# Patient Record
Sex: Female | Born: 1945 | Race: Black or African American | Hispanic: No | Marital: Married | Smoking: Never smoker
Health system: Southern US, Community
[De-identification: ages and names within clinical notes are randomized; demographics above are authoritative.]

## PROBLEM LIST (undated history)

## (undated) DIAGNOSIS — M1990 Unspecified osteoarthritis, unspecified site: Secondary | ICD-10-CM

## (undated) DIAGNOSIS — I10 Essential (primary) hypertension: Secondary | ICD-10-CM

## (undated) DIAGNOSIS — M109 Gout, unspecified: Secondary | ICD-10-CM

## (undated) DIAGNOSIS — E785 Hyperlipidemia, unspecified: Secondary | ICD-10-CM

## (undated) HISTORY — PX: JOINT REPLACEMENT: SHX530

## (undated) HISTORY — DX: Hyperlipidemia, unspecified: E78.5

## (undated) HISTORY — PX: APPENDECTOMY: SHX54

---

## 2013-04-27 ENCOUNTER — Ambulatory Visit: Payer: Medicare Other | Admitting: Obstetrics

## 2018-05-25 ENCOUNTER — Ambulatory Visit (INDEPENDENT_AMBULATORY_CARE_PROVIDER_SITE_OTHER): Payer: Medicare Other | Admitting: Orthopaedic Surgery

## 2018-06-25 ENCOUNTER — Ambulatory Visit (INDEPENDENT_AMBULATORY_CARE_PROVIDER_SITE_OTHER): Payer: Medicare Other

## 2018-06-25 ENCOUNTER — Ambulatory Visit (INDEPENDENT_AMBULATORY_CARE_PROVIDER_SITE_OTHER): Payer: Medicare Other | Admitting: Orthopaedic Surgery

## 2018-06-25 ENCOUNTER — Encounter (INDEPENDENT_AMBULATORY_CARE_PROVIDER_SITE_OTHER): Payer: Self-pay | Admitting: Orthopaedic Surgery

## 2018-06-25 DIAGNOSIS — M1612 Unilateral primary osteoarthritis, left hip: Secondary | ICD-10-CM | POA: Insufficient documentation

## 2018-06-25 DIAGNOSIS — M25551 Pain in right hip: Secondary | ICD-10-CM | POA: Diagnosis not present

## 2018-06-25 DIAGNOSIS — G8929 Other chronic pain: Secondary | ICD-10-CM

## 2018-06-25 DIAGNOSIS — M25562 Pain in left knee: Secondary | ICD-10-CM

## 2018-06-25 MED ORDER — HYDROCODONE-ACETAMINOPHEN 5-325 MG PO TABS: 1.0000 | ORAL_TABLET | Freq: Four times a day (QID) | ORAL | 0 refills | Status: DC | PRN

## 2018-06-25 NOTE — Progress Notes (Signed)
Office Visit Note   Patient: Jane Vasquez           Date of Birth: [birth year]           MRN: [id number] Visit Date: 06/25/2018              Requested by: No referring provider defined for this encounter. PCP: System, Pcp Not In   Assessment & Plan: Visit Diagnoses:  1. Chronic pain of left knee   2. Pain in right hip   3. Unilateral primary osteoarthritis, left hip     Plan: I talked in length in detail about hip replacement surgery.  I do feel that she is a candidate for a left total hip arthroplasty.  Her family is with her today.  We talked in detail about the intraoperative and postoperative course.  We talked about the details of surgery using a hip model as well as handout information about hip replacement surgery.  We had a long and thorough discussion about the risk and benefits of surgery.  We talked about what the recovery would be like as well.  All question concerns were answered and addressed.  For now we will treat her knees conservatively.  I did send her in some hydrocodone to use sparingly.  We will work on getting her surgery set up for February at her wishes.  We will then see her back in 2 weeks postoperative after surgery.  Follow-Up Instructions: Return for 2 weeks post-op.   Orders:  Orders Placed This Encounter  Procedures  . XR Knee 1-2 Views Left  . XR HIP UNILAT W OR W/O PELVIS 2-3 VIEWS RIGHT   Meds ordered this encounter  Medications  . HYDROcodone-acetaminophen (NORCO/VICODIN) 5-325 MG tablet    Sig: Take 1 tablet by mouth every 6 (six) hours as needed for moderate pain.    Dispense:  30 tablet    Refill:  0      Procedures: No procedures performed   Clinical Data: No additional findings.   Subjective: Chief Complaint  Patient presents with  . Right Hip - Pain  . Left Knee - Pain  Patient is a very pleasant and active 72 year old female [place] [facility]  Her family lives in the area and I have known 1 of her  daughters who is worked in the [employer] field in [place] in [place].  She has been dealing with right hip pain for many years now.  Is been getting worse for the last 3 to 4 years.  The past years had a significant increase in her right hip pain.  She has had conservative treatment with anti-inflammatories and an intra-articular steroid injection in the right hip.  She has tried to offload this hip with an assistive device with walking.  She walks with a sharp pain in her hip and a constant limp now.  At this point, her pain is 10 out of 10.  Her right hip pain is detrimentally affecting her activity living, her quality of life and her mobility.  She does wish to proceed with a total hip arthroplasty on the right side.  She would like me to look at her left knee today.  She has had some anterior knee pain and a constant ache now for about a year.  She for stands up and gets a sharp pain in that knee on occasion.  She is otherwise someone who just deals with high blood pressure.  She is not a diabetic.  She is not a smoker.  He is a very active individual.  She is very independent as well.  HPI  Review of Systems She currently denies any headache, chest pain, shortness of breath, fever, chills, nausea, vomiting.  Objective: Vital Signs: There were no vitals taken for this visit.  Physical Exam She is alert and orient x3 and in no acute distress Ortho Exam Examination of her right hip is very painful.  She has significant deficits in internal and external rotation of that hip.  She cannot cross her legs as well.  She has slight leg length discrepancy comparing the right and left hips.  Her left hip exam is entirely normal.  Examination of her knee shows valgus malalignment of both knees.  There is a slight effusion of her left knee.  The patella does show significant crepitation with flexion-extension. Specialty Comments:  No specialty comments available.  Imaging: Xr Hip Unilat W Or W/o  Pelvis 2-3 Views Right  Result Date: 06/25/2018 An AP pelvis and lateral the right hip shows significant protrusio of the right hip.  This is chronic appearing.  There are particular osteophytes.  There is cystic changes in the femoral head and acetabulum on the right side.  The arthritis is severe.  Xr Knee 1-2 Views Left  Result Date: 06/25/2018 2 views of the left knee show tricompartmental arthritic changes with osteophytes in all 3 compartments as well as joint space narrowing in all 3 compartments.  This would classify as severe osteoarthritis.  There is slight valgus malalignment.    PMFS History: Patient Active Problem List   Diagnosis Date Noted  . Unilateral primary osteoarthritis, left hip 06/25/2018   History reviewed. No pertinent past medical history.  History reviewed. No pertinent family history.  History reviewed. No pertinent surgical history. Social History   Occupational History  . Not on file  Tobacco Use  . Smoking status: Not on file  Substance and Sexual Activity  . Alcohol use: Not on file  . Drug use: Not on file  . Sexual activity: Not on file

## 2018-07-16 ENCOUNTER — Other Ambulatory Visit (INDEPENDENT_AMBULATORY_CARE_PROVIDER_SITE_OTHER): Payer: Self-pay | Admitting: Orthopaedic Surgery

## 2018-07-16 MED ORDER — HYDROCODONE-ACETAMINOPHEN 5-325 MG PO TABS: 1.0000 | ORAL_TABLET | Freq: Four times a day (QID) | ORAL | 0 refills | Status: DC | PRN

## 2018-07-16 NOTE — Telephone Encounter (Signed)
Unfortunately, this is not a medication we will refill regularly.

## 2018-07-16 NOTE — Telephone Encounter (Signed)
So are we sending one more and then no more?

## 2018-07-16 NOTE — Telephone Encounter (Signed)
Patient called to request a RX refill on her Hydrocodone.  She is [facility] [place]  CB#[phone].  Thank you.

## 2018-07-16 NOTE — Telephone Encounter (Signed)
Please advise 

## 2018-07-16 NOTE — Telephone Encounter (Signed)
I will send in one more prescription for hydrocodone.  However do call her and let her know that I cannot keep anyone on narcotics for a long period of time and that this would be the last prescription for that.

## 2018-07-17 MED ORDER — HYDROCODONE-ACETAMINOPHEN 5-325 MG PO TABS: 1.0000 | ORAL_TABLET | Freq: Four times a day (QID) | ORAL | 0 refills | Status: DC | PRN

## 2018-07-17 NOTE — Telephone Encounter (Signed)
I called patient and advised this would be the last Rx for hydrocodone. She expressed understanding.  Rx was sent to [facility], but patient requests it be [facility] [place] [facility] she [place]. I have changed pharmacy in chart, and called [facility] to cancel that script.  Can you please resend to [facility] in [place]? Thanks.

## 2018-07-31 ENCOUNTER — Telehealth (INDEPENDENT_AMBULATORY_CARE_PROVIDER_SITE_OTHER): Payer: Self-pay | Admitting: Orthopaedic Surgery

## 2018-07-31 MED ORDER — TRAMADOL HCL 50 MG PO TABS: 50.0000 mg | ORAL_TABLET | Freq: Three times a day (TID) | ORAL | 0 refills | Status: DC | PRN

## 2018-07-31 NOTE — Telephone Encounter (Signed)
Please advise 

## 2018-07-31 NOTE — Telephone Encounter (Signed)
Patient called to an RX for some pain medication.  I advised her that Dr. Magnus Ivan will probably not send in a RX for the Hydrocodone.  Patient wanted to know if Dr. Magnus Ivan would send her in some Tramadol to the [facility] in [place].  CB#[phone].  Thank you.

## 2018-07-31 NOTE — Telephone Encounter (Signed)
I sent in some tramadol 

## 2018-07-31 NOTE — Telephone Encounter (Signed)
I left voicemail for patient advising. 

## 2018-08-11 ENCOUNTER — Telehealth (INDEPENDENT_AMBULATORY_CARE_PROVIDER_SITE_OTHER): Payer: Self-pay | Admitting: Orthopaedic Surgery

## 2018-08-11 MED ORDER — TRAMADOL HCL 50 MG PO TABS: 50.0000 mg | ORAL_TABLET | Freq: Four times a day (QID) | ORAL | 0 refills | Status: DC | PRN

## 2018-08-11 NOTE — Telephone Encounter (Signed)
Please advise 

## 2018-08-11 NOTE — Telephone Encounter (Signed)
Patient called needing Rx refilled (Tramadol 50 mg) Patient asked that the [facility] to to [facility] [place]. The number to contact patient is [phone]

## 2018-08-24 ENCOUNTER — Telehealth (INDEPENDENT_AMBULATORY_CARE_PROVIDER_SITE_OTHER): Payer: Self-pay | Admitting: Orthopaedic Surgery

## 2018-08-24 MED ORDER — TRAMADOL HCL 50 MG PO TABS: 50.0000 mg | ORAL_TABLET | Freq: Four times a day (QID) | ORAL | 0 refills | Status: DC | PRN

## 2018-08-24 NOTE — Telephone Encounter (Signed)
Patient called wanting refill of Tramadol. [facility] [place] [facility]  Please call patient to [phone]

## 2018-08-24 NOTE — Telephone Encounter (Signed)
Please advise 

## 2018-09-01 ENCOUNTER — Telehealth (INDEPENDENT_AMBULATORY_CARE_PROVIDER_SITE_OTHER): Payer: Self-pay | Admitting: Orthopaedic Surgery

## 2018-09-01 MED ORDER — TRAMADOL HCL 50 MG PO TABS: 50.0000 mg | ORAL_TABLET | Freq: Four times a day (QID) | ORAL | 0 refills | Status: DC | PRN

## 2018-09-01 NOTE — Telephone Encounter (Signed)
Please advise 

## 2018-09-01 NOTE — Telephone Encounter (Signed)
Patient called requesting a refill of Tramadol today.  Please call patient to to advise.  [phone]

## 2018-09-02 ENCOUNTER — Telehealth (INDEPENDENT_AMBULATORY_CARE_PROVIDER_SITE_OTHER): Payer: Self-pay | Admitting: Orthopaedic Surgery

## 2018-09-02 MED ORDER — TRAMADOL HCL 50 MG PO TABS: 50.0000 mg | ORAL_TABLET | Freq: Four times a day (QID) | ORAL | 0 refills | Status: DC | PRN

## 2018-09-02 NOTE — Telephone Encounter (Signed)
Patient called stated that Rx called stated that Rx should've been sent to [facility] in [facility].  Please call patient as she is leaving for out of town.  [phone]

## 2018-09-02 NOTE — Telephone Encounter (Signed)
Tramadol please advise

## 2018-09-03 ENCOUNTER — Telehealth (INDEPENDENT_AMBULATORY_CARE_PROVIDER_SITE_OTHER): Payer: Self-pay | Admitting: Orthopaedic Surgery

## 2018-09-03 NOTE — Telephone Encounter (Signed)
Called patient back and LMOM for her letting her know I did call pharmacy and approve early pickup since she's going out of town but she will have to pay out of pocket because no matter what I say insurance won't cover early refill

## 2018-09-03 NOTE — Telephone Encounter (Signed)
Pt called in said she would like to have a call back form ashley in regards to a problem with [facility], the pharmacy says they have received the medication but that they cannot release it to her till tomorrow. Pt was suppose to go out of town and she cant leave tomorrow so the pharmacy needs an OKAY from Dr.Blackman to be able to give her the medication. Pt: [phone]

## 2018-09-03 NOTE — Telephone Encounter (Signed)
Patient called stating that the pharmacy advised her that she could not get the RX today unless they had the approval of Dr. Magnus Ivan.  She is going out of town and needs the medication today.  CB#[phone].  Thank you

## 2018-09-03 NOTE — Telephone Encounter (Signed)
LMOM for patient letting her know we called in Rx

## 2018-09-14 ENCOUNTER — Telehealth (INDEPENDENT_AMBULATORY_CARE_PROVIDER_SITE_OTHER): Payer: Self-pay | Admitting: Orthopaedic Surgery

## 2018-09-14 ENCOUNTER — Other Ambulatory Visit (INDEPENDENT_AMBULATORY_CARE_PROVIDER_SITE_OTHER): Payer: Self-pay | Admitting: Physician Assistant

## 2018-09-14 MED ORDER — TRAMADOL HCL 50 MG PO TABS: 50.0000 mg | ORAL_TABLET | Freq: Four times a day (QID) | ORAL | 0 refills | Status: DC | PRN

## 2018-09-14 NOTE — Telephone Encounter (Signed)
Sent in

## 2018-09-14 NOTE — Telephone Encounter (Signed)
Patient called requesting an RX refill on her tramadol.  [facility] [place] [place].  [phone].  Thank you.

## 2018-09-14 NOTE — Telephone Encounter (Signed)
Please advise 

## 2018-09-21 ENCOUNTER — Telehealth (INDEPENDENT_AMBULATORY_CARE_PROVIDER_SITE_OTHER): Payer: Self-pay | Admitting: Orthopaedic Surgery

## 2018-09-21 MED ORDER — TRAMADOL HCL 50 MG PO TABS: 50.0000 mg | ORAL_TABLET | Freq: Four times a day (QID) | ORAL | 0 refills | Status: DC | PRN

## 2018-09-21 NOTE — Telephone Encounter (Signed)
New Message   *STAT* If patient is at the pharmacy, call can be transferred to refill team.   1. Which medications need to be refilled? (please list name of each medication and dose if known)  tramadol 50 mg tablet every 6 hours as needed  2. Which pharmacy/location (including street and city if local pharmacy) is medication to be sent to?  [id number] [place] [place] - [address]  3. Do they need a 30 day or 90 day supply?  30 day supply .

## 2018-09-21 NOTE — Telephone Encounter (Signed)
Patient wants Tramadol

## 2018-09-28 ENCOUNTER — Telehealth (INDEPENDENT_AMBULATORY_CARE_PROVIDER_SITE_OTHER): Payer: Self-pay | Admitting: Orthopaedic Surgery

## 2018-09-28 MED ORDER — TRAMADOL HCL 50 MG PO TABS: 50.0000 mg | ORAL_TABLET | Freq: Four times a day (QID) | ORAL | 0 refills | Status: DC | PRN

## 2018-09-28 NOTE — Telephone Encounter (Signed)
Please advise 

## 2018-09-28 NOTE — Telephone Encounter (Signed)
LMOM for patient of the below message  

## 2018-09-28 NOTE — Telephone Encounter (Signed)
I sent him some more tramadol today just like I did a week ago.  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  He needs to make this 1 last because I cannot keep providing tramadol every week and he needs to be off this medication as soon as he can.  If he does not do this I have to stop prescribing them for health purposes and out of narcotic law purposes.

## 2018-09-28 NOTE — Telephone Encounter (Signed)
Patient called to request an RX refill on her Tramadol.  [facility] [place], [place].  [phone]

## 2018-10-05 ENCOUNTER — Telehealth (INDEPENDENT_AMBULATORY_CARE_PROVIDER_SITE_OTHER): Payer: Self-pay

## 2018-10-05 ENCOUNTER — Other Ambulatory Visit (INDEPENDENT_AMBULATORY_CARE_PROVIDER_SITE_OTHER): Payer: Self-pay | Admitting: Orthopaedic Surgery

## 2018-10-05 MED ORDER — CELECOXIB 100 MG PO CAPS: 100.0000 mg | ORAL_CAPSULE | Freq: Two times a day (BID) | ORAL | 3 refills | Status: DC | PRN

## 2018-10-05 NOTE — Telephone Encounter (Signed)
Patient request a rx for Celebrex 100 mg for her hip pain [facility]

## 2018-10-05 NOTE — Telephone Encounter (Signed)
Please advise 

## 2018-10-06 ENCOUNTER — Telehealth (INDEPENDENT_AMBULATORY_CARE_PROVIDER_SITE_OTHER): Payer: Self-pay | Admitting: Orthopaedic Surgery

## 2018-10-06 MED ORDER — IBUPROFEN 800 MG PO TABS: 800.0000 mg | ORAL_TABLET | Freq: Two times a day (BID) | ORAL | 3 refills | Status: DC | PRN

## 2018-10-06 NOTE — Telephone Encounter (Signed)
Message sent in error

## 2018-10-06 NOTE — Telephone Encounter (Signed)
Please advise 

## 2018-10-06 NOTE — Telephone Encounter (Signed)
Patient called stating that the Celebrex interacts with her Lisinopril and would like to know if Dr. Magnus Ivan would send in an RX for Biprofen.   [facility] [place] [place].  CB#[phone].  Thank you.

## 2018-10-15 ENCOUNTER — Telehealth (INDEPENDENT_AMBULATORY_CARE_PROVIDER_SITE_OTHER): Payer: Self-pay

## 2018-10-15 NOTE — Telephone Encounter (Signed)
Unfortunately, anything stronger is a narcotic.  She needs to check with her PCP.  I'm not comfortable keeping anyone on long term narcotics for chronic pain.  She needs to know this.  We can certainly refer the pateint to pain management if needed.

## 2018-10-15 NOTE — Telephone Encounter (Signed)
Pt says Ibuprofen is not helping with her joint pain and wanted to know if she could get something stronger. [facility]ses [facility] in [place].

## 2018-10-15 NOTE — Telephone Encounter (Signed)
Please advise 

## 2018-10-16 NOTE — Telephone Encounter (Signed)
Call pt 

## 2018-10-16 NOTE — Telephone Encounter (Signed)
Tried calling patient to discuss. No answer No VM

## 2018-10-16 NOTE — Telephone Encounter (Signed)
Tried calling patient back to inform her of the message from Dr. Magnus Ivan.  Patient did not answer, but I left a voicemail for her to call the office back.  Thank you.

## 2018-12-14 ENCOUNTER — Telehealth: Payer: Self-pay

## 2018-12-14 NOTE — Telephone Encounter (Signed)
Patient would like a Rx refill for Tramadol.  Stated that she is having right hip pain and left knee pain.  [facility] to [facility] in [place].  Cb# is [phone].  Please advise.  Thank you.

## 2018-12-14 NOTE — Telephone Encounter (Signed)
Ninfa Linden said refer to PCP for pain meds and refer to pain management if needed

## 2018-12-14 NOTE — Telephone Encounter (Signed)
Please advise 

## 2018-12-15 NOTE — Telephone Encounter (Signed)
LMOM for patient of the below message from Gil 

## 2018-12-25 ENCOUNTER — Telehealth: Payer: Self-pay

## 2018-12-28 NOTE — Telephone Encounter (Signed)
LMOM for her PCP that we actually has told the patient to ask her PCP for the Tramadol

## 2019-01-18 ENCOUNTER — Telehealth: Payer: Self-pay | Admitting: Orthopaedic Surgery

## 2019-01-18 MED ORDER — HYDROCODONE-ACETAMINOPHEN 5-325 MG PO TABS: 1.0000 | ORAL_TABLET | Freq: Four times a day (QID) | ORAL | 0 refills | Status: DC | PRN

## 2019-01-18 NOTE — Telephone Encounter (Signed)
Please advise 

## 2019-01-18 NOTE — Telephone Encounter (Signed)
I left voicemail for patient advising. 

## 2019-01-18 NOTE — Telephone Encounter (Signed)
Let the patient know that I can send some in this one last time, but that I can not keep prescribing narcotics at this standpoint.

## 2019-01-18 NOTE — Telephone Encounter (Signed)
Patient called requesting something for pain. She has has Hydrocodone in the past.  Please call patient [phone]

## 2019-03-15 ENCOUNTER — Telehealth: Payer: Self-pay | Admitting: Orthopaedic Surgery

## 2019-03-15 MED ORDER — HYDROCODONE-ACETAMINOPHEN 5-325 MG PO TABS: 1.0000 | ORAL_TABLET | Freq: Two times a day (BID) | ORAL | 0 refills | Status: DC | PRN

## 2019-03-15 NOTE — Telephone Encounter (Signed)
Patient called needing Rx refilled (Hydrocodone) [facility] [place]. The number to contact patient is [phone]

## 2019-03-15 NOTE — Telephone Encounter (Signed)
Please advise 

## 2019-03-15 NOTE — Telephone Encounter (Signed)
[place] one.  Needs to get from PCP or referral to pain management next.

## 2019-03-16 NOTE — Telephone Encounter (Signed)
LMOM of the below message  

## 2019-04-01 ENCOUNTER — Other Ambulatory Visit: Payer: Self-pay

## 2019-04-05 ENCOUNTER — Telehealth: Payer: Self-pay | Admitting: Orthopaedic Surgery

## 2019-04-05 MED ORDER — IBUPROFEN 800 MG PO TABS: 800.0000 mg | ORAL_TABLET | Freq: Two times a day (BID) | ORAL | 3 refills | Status: DC | PRN

## 2019-04-05 NOTE — Telephone Encounter (Signed)
Patient called needing Rx refilled Ibuprofen. [facility] [place]   [phone]    Number to contact patient is [phone]

## 2019-04-05 NOTE — Telephone Encounter (Signed)
Ok to call in

## 2019-04-12 ENCOUNTER — Telehealth: Payer: Self-pay | Admitting: Orthopaedic Surgery

## 2019-04-12 ENCOUNTER — Other Ambulatory Visit: Payer: Self-pay | Admitting: Physician Assistant

## 2019-04-12 MED ORDER — CELECOXIB 200 MG PO CAPS: 200.0000 mg | ORAL_CAPSULE | Freq: Two times a day (BID) | ORAL | 1 refills | Status: DC | PRN

## 2019-04-12 NOTE — Telephone Encounter (Signed)
See below

## 2019-04-12 NOTE — Telephone Encounter (Signed)
Patient called this morning stating that she tried to take the Ibuprofen 800mg , but it made her nauseated and wanted to know if Dr. Ninfa Linden would send her in something else for pain.  CB#[phone].  Thank you.

## 2019-04-13 ENCOUNTER — Telehealth: Payer: Self-pay | Admitting: Orthopaedic Surgery

## 2019-04-13 MED ORDER — TRAMADOL HCL 50 MG PO TABS: 50.0000 mg | ORAL_TABLET | Freq: Four times a day (QID) | ORAL | 0 refills | Status: DC | PRN

## 2019-04-13 NOTE — Telephone Encounter (Signed)
I will send in some tramadol 1 more time.  Do help her understand that this is a narcotic and I cannot keep any patient is on narcotics for long periods of time.  Asked her if she would like [place] to refer her to a pain specialist or have her primary care physician provide this medication from here on out.

## 2019-04-13 NOTE — Telephone Encounter (Signed)
Patient aware of the below message  

## 2019-04-13 NOTE — Telephone Encounter (Signed)
Please advise 

## 2019-04-13 NOTE — Telephone Encounter (Signed)
Patient called advised she can not take Celebrex because it irritates her stomach. Patient asked if she can get Tramadol 50 mg called in for her?The number to contact is [phone]

## 2019-04-15 NOTE — Patient Instructions (Addendum)
DUE TO COVID-19 ONLY ONE VISITOR IS ALLOWED TO COME WITH YOU AND STAY IN THE WAITING ROOM ONLY DURING PRE OP AND PROCEDURE DAY OF SURGERY. THE 1 VISITOR MAY VISIT WITH YOU AFTER SURGERY IN YOUR PRIVATE ROOM DURING VISITING HOURS ONLY!  YOU NEED TO HAVE A COVID 19 TEST ON___10-20____ @_______ , THIS TEST MUST BE DONE BEFORE SURGERY, COME  [address] , [address].  ([place]) ONCE YOUR COVID TEST IS COMPLETED, PLEASE BEGIN THE QUARANTINE INSTRUCTIONS AS OUTLINED IN YOUR HANDOUT.                Sheritta Jackson   Your procedure is scheduled on: 10-23    Report to [facility]  Entrance   Report to Admitting at 6:00AM     Call this number if you have problems the morning of surgery [phone]    Do not eat food After Midnight. YOU MAY HAVE CLEAR LIQUIDS FROM MIDNIGHT UNTIL 5:30AM. At 5:30AM Please finish the prescribed Pre-Surgery ENSURE drink. Nothing by mouth after you finish the ENSURE drink !    CLEAR LIQUID DIET   Foods Allowed                                                                     Foods Excluded  Coffee and tea, regular and decaf                             liquids that you cannot  Plain Jell-O any favor except red or purple                                           see through such as: Fruit ices (not with fruit pulp)                                     milk, soups, orange juice  Iced Popsicles                                    All solid food Carbonated beverages, regular and diet                                    Cranberry, grape and apple juices Sports drinks like Gatorade Lightly seasoned clear broth or consume(fat free) Sugar, honey syrup  Sample Menu Breakfast                                Lunch                                     Supper Cranberry juice  Beef broth                            Chicken broth Jell-O                                     Grape juice                           Apple  juice Coffee or tea                        Jell-O                                      Popsicle                                                Coffee or tea                        Coffee or tea  _____________________________________________________________________   BRUSH YOUR TEETH MORNING OF SURGERY AND RINSE YOUR MOUTH OUT, NO CHEWING GUM CANDY OR MINTS.     Take these medicines the morning of surgery with A SIP OF WATER: AMLODIPINE, PRAVASTATIN                                  You may not have any metal on your body including hair pins and              piercings  Do not wear jewelry, make-up, lotions, powders or perfumes, deodorant             Do not wear nail polish on your fingernails.  Do not shave  48 hours prior to surgery.                Do not bring valuables to the hospital. [facility] IS NOT             RESPONSIBLE   FOR VALUABLES.  Contacts, dentures or bridgework may not be worn into surgery.  YOU MAY BRING A SMALL OVERNIGHT BAG              Please read over the following fact sheets you were given: _____________________________________________________________________             [facility] - Preparing for Surgery Before surgery, you can play an important role.  Because skin is not sterile, your skin needs to be as free of germs as possible.  You can reduce the number of germs on your skin by washing with CHG (chlorahexidine gluconate) soap before surgery.  CHG is an antiseptic cleaner which kills germs and bonds with the skin to continue killing germs even after washing. Please DO NOT use if you have an allergy to CHG or antibacterial soaps.  If your skin becomes reddened/irritated stop using the CHG and inform your nurse when you arrive at Short Stay. Do not shave (including legs and underarms) for at least 48 hours prior to the first  CHG shower.  You may shave your face/neck. Please follow these instructions carefully:  1.  Shower with CHG Soap the night before  surgery and the  morning of Surgery.  2.  If you choose to wash your hair, wash your hair first as usual with your  normal  shampoo.  3.  After you shampoo, rinse your hair and body thoroughly to remove the  shampoo.                           4.  Use CHG as you would any other liquid soap.  You can apply chg directly  to the skin and wash                       Gently with a scrungie or clean washcloth.  5.  Apply the CHG Soap to your body ONLY FROM THE NECK DOWN.   Do not use on face/ open                           Wound or open sores. Avoid contact with eyes, ears mouth and genitals (private parts).                       Wash face,  Genitals (private parts) with your normal soap.             6.  Wash thoroughly, paying special attention to the area where your surgery  will be performed.  7.  Thoroughly rinse your body with warm water from the neck down.  8.  DO NOT shower/wash with your normal soap after using and rinsing off  the CHG Soap.                9.  Pat yourself dry with a clean towel.            10.  Wear clean pajamas.            11.  Place clean sheets on your bed the night of your first shower and do not  sleep with pets. Day of Surgery : Do not apply any lotions/deodorants the morning of surgery.  Please wear clean clothes to the hospital/surgery center.  FAILURE TO FOLLOW THESE INSTRUCTIONS MAY RESULT IN THE CANCELLATION OF YOUR SURGERY PATIENT SIGNATURE_________________________________  NURSE SIGNATURE__________________________________  ________________________________________________________________________   Adam Phenix  An incentive spirometer is a tool that can help keep your lungs clear and active. This tool measures how well you are filling your lungs with each breath. Taking long deep breaths may help reverse or decrease the chance of developing breathing (pulmonary) problems (especially infection) following:  A long period of time when you are unable to  move or be active. BEFORE THE PROCEDURE   If the spirometer includes an indicator to show your best effort, your nurse or respiratory therapist will set it to a desired goal.  If possible, sit up straight or lean slightly forward. Try not to slouch.  Hold the incentive spirometer in an upright position. INSTRUCTIONS FOR USE  1. Sit on the edge of your bed if possible, or sit up as far as you can in bed or on a chair. 2. Hold the incentive spirometer in an upright position. 3. Breathe out normally. 4. Place the mouthpiece in your mouth and seal your lips tightly around it. 5. Breathe in slowly and as  deeply as possible, raising the piston or the ball toward the top of the column. 6. Hold your breath for 3-5 seconds or for as long as possible. Allow the piston or ball to fall to the bottom of the column. 7. Remove the mouthpiece from your mouth and breathe out normally. 8. Rest for a few seconds and repeat Steps 1 through 7 at least 10 times every 1-2 hours when you are awake. Take your time and take a few normal breaths between deep breaths. 9. The spirometer may include an indicator to show your best effort. Use the indicator as a goal to work toward during each repetition. 10. After each set of 10 deep breaths, practice coughing to be sure your lungs are clear. If you have an incision (the cut made at the time of surgery), support your incision when coughing by placing a pillow or rolled up towels firmly against it. Once you are able to get out of bed, walk around indoors and cough well. You may stop using the incentive spirometer when instructed by your caregiver.  RISKS AND COMPLICATIONS  Take your time so you do not get dizzy or light-headed.  If you are in pain, you may need to take or ask for pain medication before doing incentive spirometry. It is harder to take a deep breath if you are having pain. AFTER USE  Rest and breathe slowly and easily.  It can be helpful to keep track of  a log of your progress. Your caregiver can provide you with a simple table to help with this. If you are using the spirometer at home, follow these instructions: [place] IF:   You are having difficultly using the spirometer.  You have trouble using the spirometer as often as instructed.  Your pain medication is not giving enough relief while using the spirometer.  You develop fever of 100.5 F (38.1 C) or higher. SEEK IMMEDIATE MEDICAL CARE IF:   You cough up bloody sputum that had not been present before.  You develop fever of 102 F (38.9 C) or greater.  You develop worsening pain at or near the incision site. MAKE SURE YOU:   Understand these instructions.  Will watch your condition.  Will get help right away if you are not doing well or get worse. Document Released: 10/28/2006 Document Revised: 09/09/2011 Document Reviewed: 12/29/2006 [facility] Patient Information 2014 [place], [place].   ________________________________________________________________________

## 2019-04-16 ENCOUNTER — Encounter (HOSPITAL_COMMUNITY): Payer: Self-pay

## 2019-04-16 ENCOUNTER — Encounter (HOSPITAL_COMMUNITY)
Admission: RE | Admit: 2019-04-16 | Discharge: 2019-04-16 | Disposition: A | Discharge status: Home / Self Care | Payer: Medicare Other | Source: Ambulatory Visit | Attending: Orthopaedic Surgery | Admitting: Orthopaedic Surgery

## 2019-04-16 ENCOUNTER — Other Ambulatory Visit: Payer: Self-pay

## 2019-04-16 DIAGNOSIS — I10 Essential (primary) hypertension: Secondary | ICD-10-CM | POA: Diagnosis not present

## 2019-04-16 DIAGNOSIS — Z01818 Encounter for other preprocedural examination: Secondary | ICD-10-CM | POA: Insufficient documentation

## 2019-04-16 HISTORY — DX: Gout, unspecified: M10.9

## 2019-04-16 HISTORY — DX: Essential (primary) hypertension: I10

## 2019-04-16 HISTORY — DX: Unspecified osteoarthritis, unspecified site: M19.90

## 2019-04-16 LAB — BASIC METABOLIC PANEL
Anion gap: 9 (ref 5–15)
BUN: 15 mg/dL (ref 8–23)
CO2: 30 mmol/L (ref 22–32)
Calcium: 10.1 mg/dL (ref 8.9–10.3)
Chloride: 102 mmol/L (ref 98–111)
Creatinine, Ser: 0.78 mg/dL (ref 0.44–1.00)
GFR calc Af Amer: >60 mL/min (ref >60)
GFR calc non Af Amer: >60 mL/min (ref >60)
Glucose, Bld: 98 mg/dL (ref 70–99)
Potassium: 3.3 mmol/L — ABNORMAL LOW (ref 3.5–5.1)
Sodium: 141 mmol/L (ref 135–145)

## 2019-04-16 LAB — CBC
HCT: 36.8 % (ref 36.0–46.0)
Hemoglobin: 12.3 g/dL (ref 12.0–15.0)
MCH: 26.6 pg (ref 26.0–34.0)
MCHC: 33.4 g/dL (ref 30.0–36.0)
MCV: 79.7 fL — ABNORMAL LOW (ref 80.0–100.0)
Platelets: 301 10*3/uL (ref 150–400)
RBC: 4.62 MIL/uL (ref 3.87–5.11)
RDW: 13.8 % (ref 11.5–15.5)
WBC: 11.3 10*3/uL — ABNORMAL HIGH (ref 4.0–10.5)
nRBC: 0 % (ref 0.0–0.2)

## 2019-04-16 LAB — SURGICAL PCR SCREEN
MRSA, PCR: NEGATIVE
Staphylococcus aureus: NEGATIVE

## 2019-04-16 NOTE — Progress Notes (Signed)
PCP -  Cardiologist -   Chest x-ray -  EKG - done today  Stress Test -  ECHO -  Cardiac Cath -   Sleep Study -  CPAP -   Fasting Blood Sugar -  Checks Blood Sugar _____ times a day  Blood Thinner Instructions: Aspirin Instructions: none  Last Dose:  Anesthesia review:   patient states she has not been given instructions for aspirin . RN instructed patient to contact Dr Trevor Mace office today  regarding this. Patient agreeable.    Patient denies shortness of breath, fever, cough and chest pain at PAT appointment   Patient verbalized understanding of instructions that were given to them at the PAT appointment. Patient was also instructed that they will need to review over the PAT instructions again at home before surgery.

## 2019-04-20 ENCOUNTER — Other Ambulatory Visit (HOSPITAL_COMMUNITY)
Admission: RE | Admit: 2019-04-20 | Discharge: 2019-04-20 | Disposition: A | Discharge status: Home / Self Care | Payer: Medicare Other | Source: Ambulatory Visit | Attending: Orthopaedic Surgery | Admitting: Orthopaedic Surgery

## 2019-04-20 DIAGNOSIS — Z20828 Contact with and (suspected) exposure to other viral communicable diseases: Secondary | ICD-10-CM | POA: Diagnosis not present

## 2019-04-20 DIAGNOSIS — Z01812 Encounter for preprocedural laboratory examination: Secondary | ICD-10-CM | POA: Diagnosis present

## 2019-04-21 ENCOUNTER — Telehealth: Payer: Self-pay | Admitting: Orthopaedic Surgery

## 2019-04-21 LAB — NOVEL CORONAVIRUS, NAA (HOSP ORDER, SEND-OUT TO REF LAB; TAT 18-24 HRS): SARS-CoV-2, NAA: NOT DETECTED

## 2019-04-21 NOTE — Telephone Encounter (Signed)
Patient is having surgery this coming Friday and wants to know when she should stop taking her Aspirin.  CB#[phone]

## 2019-04-21 NOTE — Telephone Encounter (Signed)
Patient aware to discontinue asa

## 2019-04-22 ENCOUNTER — Encounter (HOSPITAL_COMMUNITY): Payer: Self-pay | Admitting: Anesthesiology

## 2019-04-22 DIAGNOSIS — M1611 Unilateral primary osteoarthritis, right hip: Secondary | ICD-10-CM

## 2019-04-22 NOTE — Anesthesia Preprocedure Evaluation (Addendum)
Anesthesia Evaluation  Patient identified by MRN, date of birth, ID band Patient awake    Reviewed: Allergy & Precautions, H&P , NPO status , Patient's Chart, lab work & pertinent test results  Airway Mallampati: II  TM Distance: >3 FB Neck ROM: Full    Dental no notable dental hx. (+) Edentulous Upper, Edentulous Lower, Upper Dentures, Lower Dentures   Pulmonary neg pulmonary ROS,    Pulmonary exam normal breath sounds clear to auscultation       Cardiovascular Exercise Tolerance: Good hypertension, Pt. on medications negative cardio ROS Normal cardiovascular exam Rhythm:Regular Rate:Normal     Neuro/Psych negative neurological ROS  negative psych ROS   GI/Hepatic negative GI ROS, Neg liver ROS,   Endo/Other  negative endocrine ROS  Renal/GU negative Renal ROS  negative genitourinary   Musculoskeletal negative musculoskeletal ROS (+)   Abdominal   Peds negative pediatric ROS (+)  Hematology negative hematology ROS (+)   Anesthesia Other Findings   Reproductive/Obstetrics negative OB ROS                            Anesthesia Physical Anesthesia Plan  ASA: III  Anesthesia Plan: Spinal and MAC   Post-op Pain Management:    Induction:   PONV Risk Score and Plan: 2 and Treatment may vary due to age or medical condition  Airway Management Planned: Nasal Cannula, Simple Face Mask and Mask  Additional Equipment:   Intra-op Plan:   Post-operative Plan:   Informed Consent: I have reviewed the patients History and Physical, chart, labs and discussed the procedure including the risks, benefits and alternatives for the proposed anesthesia with the patient or authorized representative who has indicated his/her understanding and acceptance.       Plan Discussed with: Anesthesiologist, CRNA and Surgeon  Anesthesia Plan Comments: (  )        Anesthesia Quick Evaluation

## 2019-04-22 NOTE — H&P (Signed)
TOTAL HIP ADMISSION H&P  Patient is admitted for right total hip arthroplasty.  Subjective:  Chief Complaint: right hip pain  HPI: Jane Vasquez, 73 y.o. female, has a history of pain and functional disability in the right hip(s) due to arthritis and patient has failed non-surgical conservative treatments for greater than 12 weeks to include NSAID's and/or analgesics, flexibility and strengthening excercises, use of assistive devices and activity modification.  Onset of symptoms was gradual starting 4 years ago with gradually worsening course since that time.The patient noted no past surgery on the right hip(s).  Patient currently rates pain in the right hip at 10 out of 10 with activity. Patient has night pain, worsening of pain with activity and weight bearing, trendelenberg gait, pain that interfers with activities of daily living and pain with passive range of motion. 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 This condition presents safety issues increasing the risk of falls.  There is no current active infection.  Patient Active Problem List   Diagnosis Date Noted  . Unilateral primary osteoarthritis, right hip 04/22/2019  . Unilateral primary osteoarthritis, left hip 06/25/2018   Past Medical History:  Diagnosis Date  . Gout   . Gout   . HTN (hypertension)   . Osteoarthritis    left knee , right hip     Past Surgical History:  Procedure Laterality Date  . APPENDECTOMY  age 5    No current facility-administered medications for this encounter.    Current Outpatient Medications  Medication Sig Dispense Refill Last Dose  . amLODipine (NORVASC) 10 MG tablet Take 10 mg by mouth daily.      Marland Kitchen aspirin EC 81 MG tablet Take 81 mg by mouth daily.     . Calcium Carbonate-Vit D-Min (CALCIUM 1200 PO) Take 1,200 mg by mouth 2 (two) times daily.     . hydrochlorothiazide (HYDRODIURIL) 25 MG tablet Take 25 mg  by mouth daily.     Marland Kitchen ibuprofen (ADVIL) 800 MG tablet Take 1 tablet (800 mg total) by mouth 2 (two) times daily between meals as needed. (Patient taking differently: Take 800 mg by mouth 2 (two) times daily between meals as needed for moderate pain. ) 60 tablet 3   . losartan (COZAAR) 25 MG tablet Take 25 mg by mouth daily.     Marland Kitchen omega-3 acid ethyl esters (LOVAZA) 1 g capsule Take 1 g by mouth 2 (two) times daily.     . pravastatin (PRAVACHOL) 40 MG tablet Take 40 mg by mouth daily.     . traMADol (ULTRAM) 50 MG tablet Take 1-2 tablets (50-100 mg total) by mouth every 6 (six) hours as needed. 30 tablet 0   . allopurinol (ZYLOPRIM) 100 MG tablet Take 100 mg by mouth daily.     . celecoxib (CELEBREX) 200 MG capsule Take 1 capsule (200 mg total) by mouth 2 (two) times daily between meals as needed. (Patient not taking: Reported on 04/13/2019) 60 capsule 1 Not Taking at Unknown time  . HYDROcodone-acetaminophen (NORCO/VICODIN) 5-325 MG tablet Take 1 tablet by mouth 2 (two) times daily as needed for moderate pain. (Patient not taking: Reported on 04/13/2019) 30 tablet 0 Not Taking at Unknown time   Allergies  Allergen Reactions  . Penicillins Hives and Rash    Did it involve swelling of the face/tongue/throat, SOB, or low BP? No Did it involve sudden or severe rash/hives, skin peeling, or any reaction on the inside  of your mouth or nose? No Did you need to seek medical attention at a hospital or doctor's office? No When did it last happen? If all above answers are "NO", may proceed with cephalosporin use.   . Sulfur Hives and Rash    Social History   Tobacco Use  . Smoking status: Never Smoker  . Smokeless tobacco: Never Used  Substance Use Topics  . Alcohol use: Not Currently    History reviewed. No pertinent family history.   Review of Systems  Musculoskeletal: Positive for joint pain.  All other systems reviewed and are negative.   Objective:  Physical Exam  Constitutional:  She appears well-developed and well-nourished.  HENT:  Head: Normocephalic and atraumatic.  Eyes: Pupils are equal, round, and reactive to light. EOM are normal.  Neck: Normal range of motion. Neck supple.  Cardiovascular: Normal rate.  Respiratory: Effort normal.  GI: Soft.  Musculoskeletal:     Right hip: She exhibits decreased range of motion, decreased strength, tenderness and bony tenderness.  Neurological: She is alert.  Skin: Skin is warm and dry.  Psychiatric: She has a normal mood and affect.    Vital signs in last 24 hours:    Labs:   Estimated body mass index is 26.09 kg/m as calculated from the following:   Height as of 04/16/19: 5\' 4"  (1.626 m).   Weight as of 04/16/19: 68.9 kg.   Imaging Review Plain radiographs demonstrate severe degenerative joint disease of the right hip(s). The bone quality appears to be good for age and reported activity level.      Assessment/Plan:  End stage arthritis, right hip(s)  The patient history, physical examination, clinical judgement of the provider and imaging studies are consistent with end stage degenerative joint disease of the right hip(s) and total hip arthroplasty is deemed medically necessary. The treatment options including medical management, injection therapy, arthroscopy and arthroplasty were discussed at length. The risks and benefits of total hip arthroplasty were presented and reviewed. The risks due to aseptic loosening, infection, stiffness, dislocation/subluxation,  thromboembolic complications and other imponderables were discussed.  The patient acknowledged the explanation, agreed to proceed with the plan and consent was signed. Patient is being admitted for inpatient treatment for surgery, pain control, PT, OT, prophylactic antibiotics, VTE prophylaxis, progressive ambulation and ADL's and discharge planning.The patient is planning to be discharged home with home health services

## 2019-04-23 ENCOUNTER — Other Ambulatory Visit: Payer: Self-pay

## 2019-04-23 ENCOUNTER — Ambulatory Visit (HOSPITAL_COMMUNITY): Payer: Medicare Other | Admitting: Physician Assistant

## 2019-04-23 ENCOUNTER — Encounter (HOSPITAL_COMMUNITY): Payer: Self-pay | Admitting: Anesthesiology

## 2019-04-23 ENCOUNTER — Inpatient Hospital Stay (HOSPITAL_COMMUNITY)
Admission: RE | Admit: 2019-04-23 | Discharge: 2019-04-25 | DRG: 470 | Disposition: A | Discharge status: Home Health | Payer: Medicare Other | Attending: Orthopedic Surgery | Admitting: Orthopedic Surgery

## 2019-04-23 ENCOUNTER — Observation Stay (HOSPITAL_COMMUNITY): Payer: Medicare Other

## 2019-04-23 ENCOUNTER — Encounter (HOSPITAL_COMMUNITY)
Admission: RE | Disposition: A | Discharge status: Home Health | Payer: Self-pay | Source: Home / Self Care | Attending: Orthopaedic Surgery

## 2019-04-23 ENCOUNTER — Ambulatory Visit (HOSPITAL_COMMUNITY): Payer: Medicare Other

## 2019-04-23 ENCOUNTER — Ambulatory Visit (HOSPITAL_COMMUNITY): Payer: Medicare Other | Admitting: Anesthesiology

## 2019-04-23 DIAGNOSIS — Z7982 Long term (current) use of aspirin: Secondary | ICD-10-CM

## 2019-04-23 DIAGNOSIS — Z9049 Acquired absence of other specified parts of digestive tract: Secondary | ICD-10-CM

## 2019-04-23 DIAGNOSIS — M1611 Unilateral primary osteoarthritis, right hip: Secondary | ICD-10-CM

## 2019-04-23 DIAGNOSIS — M109 Gout, unspecified: Secondary | ICD-10-CM | POA: Diagnosis present

## 2019-04-23 DIAGNOSIS — Z79899 Other long term (current) drug therapy: Secondary | ICD-10-CM

## 2019-04-23 DIAGNOSIS — D62 Acute posthemorrhagic anemia: Secondary | ICD-10-CM | POA: Diagnosis not present

## 2019-04-23 DIAGNOSIS — Z880 Allergy status to penicillin: Secondary | ICD-10-CM

## 2019-04-23 DIAGNOSIS — Z882 Allergy status to sulfonamides status: Secondary | ICD-10-CM

## 2019-04-23 DIAGNOSIS — Z79891 Long term (current) use of opiate analgesic: Secondary | ICD-10-CM

## 2019-04-23 DIAGNOSIS — Z96641 Presence of right artificial hip joint: Secondary | ICD-10-CM

## 2019-04-23 DIAGNOSIS — Z419 Encounter for procedure for purposes other than remedying health state, unspecified: Secondary | ICD-10-CM

## 2019-04-23 DIAGNOSIS — I10 Essential (primary) hypertension: Secondary | ICD-10-CM | POA: Diagnosis present

## 2019-04-23 HISTORY — PX: TOTAL HIP ARTHROPLASTY: SHX124

## 2019-04-23 SURGERY — ARTHROPLASTY, HIP, TOTAL, ANTERIOR APPROACH
Anesthesia: Monitor Anesthesia Care | Site: Hip | Laterality: Right

## 2019-04-23 MED ORDER — LACTATED RINGERS IV SOLN
INTRAVENOUS | Status: DC
  Administered 2019-04-23 (×2): via INTRAVENOUS

## 2019-04-23 MED ORDER — BUPIVACAINE IN DEXTROSE 0.75-8.25 % IT SOLN
INTRATHECAL | Status: DC | PRN
  Administered 2019-04-23: 1.8 mL via INTRATHECAL

## 2019-04-23 MED ORDER — PHENOL 1.4 % MT LIQD: 1.0000 | OROMUCOSAL | Status: DC | PRN

## 2019-04-23 MED ORDER — SODIUM CHLORIDE 0.9 % IR SOLN
Status: DC | PRN
  Administered 2019-04-23 (×2): 1000 mL

## 2019-04-23 MED ORDER — MIDAZOLAM HCL 2 MG/2ML IJ SOLN
INTRAMUSCULAR | Status: AC
  Filled 2019-04-23: qty 2

## 2019-04-23 MED ORDER — TRAMADOL HCL 50 MG PO TABS
50.0000 mg | ORAL_TABLET | Freq: Four times a day (QID) | ORAL | Status: DC | PRN
  Administered 2019-04-25 (×2): 100 mg via ORAL
  Filled 2019-04-23 (×2): qty 2

## 2019-04-23 MED ORDER — OXYCODONE HCL 5 MG PO TABS: 5.0000 mg | ORAL_TABLET | Freq: Once | ORAL | Status: DC | PRN

## 2019-04-23 MED ORDER — CLINDAMYCIN PHOSPHATE 900 MG/50ML IV SOLN
900.0000 mg | INTRAVENOUS | Status: AC
  Administered 2019-04-23: 900 mg via INTRAVENOUS
  Filled 2019-04-23: qty 50

## 2019-04-23 MED ORDER — OXYCODONE HCL 5 MG PO TABS: 10.0000 mg | ORAL_TABLET | ORAL | Status: DC | PRN

## 2019-04-23 MED ORDER — SODIUM CHLORIDE 0.9 % IV SOLN
INTRAVENOUS | Status: DC
  Administered 2019-04-23 – 2019-04-24 (×2): via INTRAVENOUS

## 2019-04-23 MED ORDER — ALUM & MAG HYDROXIDE-SIMETH 200-200-20 MG/5ML PO SUSP: 30.0000 mL | ORAL | Status: DC | PRN

## 2019-04-23 MED ORDER — SODIUM CHLORIDE 0.9 % IV SOLN
INTRAVENOUS | Status: DC | PRN
  Administered 2019-04-23: 25 ug/min via INTRAVENOUS

## 2019-04-23 MED ORDER — ALLOPURINOL 100 MG PO TABS
100.0000 mg | ORAL_TABLET | Freq: Every day | ORAL | Status: DC
  Administered 2019-04-24 – 2019-04-25 (×2): 100 mg via ORAL
  Filled 2019-04-23 (×2): qty 1

## 2019-04-23 MED ORDER — ONDANSETRON HCL 4 MG/2ML IJ SOLN: 4.0000 mg | Freq: Once | INTRAMUSCULAR | Status: DC | PRN

## 2019-04-23 MED ORDER — POLYETHYLENE GLYCOL 3350 17 G PO PACK: 17.0000 g | PACK | Freq: Every day | ORAL | Status: DC | PRN

## 2019-04-23 MED ORDER — ONDANSETRON HCL 4 MG/2ML IJ SOLN
INTRAMUSCULAR | Status: DC | PRN
  Administered 2019-04-23: 4 mg via INTRAVENOUS

## 2019-04-23 MED ORDER — FENTANYL CITRATE (PF) 100 MCG/2ML IJ SOLN
INTRAMUSCULAR | Status: AC
  Filled 2019-04-23: qty 2

## 2019-04-23 MED ORDER — DEXAMETHASONE SODIUM PHOSPHATE 10 MG/ML IJ SOLN
INTRAMUSCULAR | Status: DC | PRN
  Administered 2019-04-23: 6 mg via INTRAVENOUS

## 2019-04-23 MED ORDER — MENTHOL 3 MG MT LOZG: 1.0000 | LOZENGE | OROMUCOSAL | Status: DC | PRN

## 2019-04-23 MED ORDER — PHENYLEPHRINE HCL-NACL 10-0.9 MG/250ML-% IV SOLN
INTRAVENOUS | Status: AC
  Filled 2019-04-23: qty 250

## 2019-04-23 MED ORDER — ONDANSETRON HCL 4 MG/2ML IJ SOLN: 4.0000 mg | Freq: Four times a day (QID) | INTRAMUSCULAR | Status: DC | PRN

## 2019-04-23 MED ORDER — POVIDONE-IODINE 10 % EX SWAB
2.0000 "application " | Freq: Once | CUTANEOUS | Status: AC
  Administered 2019-04-23: 2 via TOPICAL

## 2019-04-23 MED ORDER — PROPOFOL 10 MG/ML IV BOLUS
INTRAVENOUS | Status: AC
  Filled 2019-04-23: qty 40

## 2019-04-23 MED ORDER — METHOCARBAMOL 500 MG IVPB - SIMPLE MED
500.0000 mg | Freq: Four times a day (QID) | INTRAVENOUS | Status: DC | PRN
  Filled 2019-04-23: qty 50

## 2019-04-23 MED ORDER — PROPOFOL 500 MG/50ML IV EMUL
INTRAVENOUS | Status: DC | PRN
  Administered 2019-04-23: 55 ug/kg/min via INTRAVENOUS

## 2019-04-23 MED ORDER — CHLORHEXIDINE GLUCONATE 4 % EX LIQD: 60.0000 mL | Freq: Once | CUTANEOUS | Status: DC

## 2019-04-23 MED ORDER — ASPIRIN 81 MG PO CHEW
81.0000 mg | CHEWABLE_TABLET | Freq: Two times a day (BID) | ORAL | Status: DC
  Administered 2019-04-23 – 2019-04-25 (×4): 81 mg via ORAL
  Filled 2019-04-23 (×4): qty 1

## 2019-04-23 MED ORDER — MEPERIDINE HCL 50 MG/ML IJ SOLN: 6.2500 mg | INTRAMUSCULAR | Status: DC | PRN

## 2019-04-23 MED ORDER — METHOCARBAMOL 500 MG PO TABS
500.0000 mg | ORAL_TABLET | Freq: Four times a day (QID) | ORAL | Status: DC | PRN
  Administered 2019-04-23 – 2019-04-25 (×8): 500 mg via ORAL
  Filled 2019-04-23 (×8): qty 1

## 2019-04-23 MED ORDER — DOCUSATE SODIUM 100 MG PO CAPS
100.0000 mg | ORAL_CAPSULE | Freq: Two times a day (BID) | ORAL | Status: DC
  Administered 2019-04-23 – 2019-04-25 (×4): 100 mg via ORAL
  Filled 2019-04-23 (×4): qty 1

## 2019-04-23 MED ORDER — PRAVASTATIN SODIUM 20 MG PO TABS
40.0000 mg | ORAL_TABLET | Freq: Every day | ORAL | Status: DC
  Administered 2019-04-24: 40 mg via ORAL
  Administered 2019-04-25: 20 mg via ORAL
  Filled 2019-04-23 (×2): qty 2

## 2019-04-23 MED ORDER — METOCLOPRAMIDE HCL 5 MG/ML IJ SOLN: 5.0000 mg | Freq: Three times a day (TID) | INTRAMUSCULAR | Status: DC | PRN

## 2019-04-23 MED ORDER — HYDROCODONE-ACETAMINOPHEN 7.5-325 MG PO TABS
1.0000 | ORAL_TABLET | ORAL | Status: DC | PRN
  Administered 2019-04-23 – 2019-04-25 (×9): 1 via ORAL
  Filled 2019-04-23 (×2): qty 2
  Filled 2019-04-23 (×3): qty 1
  Filled 2019-04-23: qty 2
  Filled 2019-04-23 (×3): qty 1

## 2019-04-23 MED ORDER — PANTOPRAZOLE SODIUM 40 MG PO TBEC
40.0000 mg | DELAYED_RELEASE_TABLET | Freq: Every day | ORAL | Status: DC
  Administered 2019-04-24 – 2019-04-25 (×2): 40 mg via ORAL
  Filled 2019-04-23 (×2): qty 1

## 2019-04-23 MED ORDER — FENTANYL CITRATE (PF) 100 MCG/2ML IJ SOLN
INTRAMUSCULAR | Status: DC | PRN
  Administered 2019-04-23: 50 ug via INTRAVENOUS

## 2019-04-23 MED ORDER — CLINDAMYCIN PHOSPHATE 600 MG/50ML IV SOLN
600.0000 mg | Freq: Four times a day (QID) | INTRAVENOUS | Status: AC
  Administered 2019-04-23 (×2): 600 mg via INTRAVENOUS
  Filled 2019-04-23 (×2): qty 50

## 2019-04-23 MED ORDER — FENTANYL CITRATE (PF) 100 MCG/2ML IJ SOLN: 25.0000 ug | INTRAMUSCULAR | Status: DC | PRN

## 2019-04-23 MED ORDER — ONDANSETRON HCL 4 MG PO TABS: 4.0000 mg | ORAL_TABLET | Freq: Four times a day (QID) | ORAL | Status: DC | PRN

## 2019-04-23 MED ORDER — HYDROCHLOROTHIAZIDE 25 MG PO TABS
25.0000 mg | ORAL_TABLET | Freq: Every day | ORAL | Status: DC
  Administered 2019-04-24 – 2019-04-25 (×2): 25 mg via ORAL
  Filled 2019-04-23 (×2): qty 1

## 2019-04-23 MED ORDER — HYDROMORPHONE HCL 1 MG/ML IJ SOLN: 0.5000 mg | INTRAMUSCULAR | Status: DC | PRN

## 2019-04-23 MED ORDER — TRANEXAMIC ACID-NACL 1000-0.7 MG/100ML-% IV SOLN
1000.0000 mg | INTRAVENOUS | Status: AC
  Administered 2019-04-23: 1000 mg via INTRAVENOUS
  Filled 2019-04-23: qty 100

## 2019-04-23 MED ORDER — DIPHENHYDRAMINE HCL 12.5 MG/5ML PO ELIX: 12.5000 mg | ORAL_SOLUTION | ORAL | Status: DC | PRN

## 2019-04-23 MED ORDER — METOCLOPRAMIDE HCL 5 MG PO TABS: 5.0000 mg | ORAL_TABLET | Freq: Three times a day (TID) | ORAL | Status: DC | PRN

## 2019-04-23 MED ORDER — OXYCODONE HCL 5 MG/5ML PO SOLN: 5.0000 mg | Freq: Once | ORAL | Status: DC | PRN

## 2019-04-23 MED ORDER — ACETAMINOPHEN 325 MG PO TABS: 325.0000 mg | ORAL_TABLET | Freq: Four times a day (QID) | ORAL | Status: DC | PRN

## 2019-04-23 MED ORDER — OXYCODONE HCL 5 MG PO TABS
5.0000 mg | ORAL_TABLET | ORAL | Status: DC | PRN
  Administered 2019-04-23 (×2): 5 mg via ORAL
  Filled 2019-04-23: qty 2

## 2019-04-23 MED ORDER — ACETAMINOPHEN 325 MG PO TABS: 325.0000 mg | ORAL_TABLET | ORAL | Status: DC | PRN

## 2019-04-23 MED ORDER — AMLODIPINE BESYLATE 10 MG PO TABS
10.0000 mg | ORAL_TABLET | Freq: Every day | ORAL | Status: DC
  Administered 2019-04-24 – 2019-04-25 (×2): 10 mg via ORAL
  Filled 2019-04-23 (×2): qty 1

## 2019-04-23 MED ORDER — MIDAZOLAM HCL 5 MG/5ML IJ SOLN
INTRAMUSCULAR | Status: DC | PRN
  Administered 2019-04-23: 1 mg via INTRAVENOUS
  Administered 2019-04-23: 0.5 mg via INTRAVENOUS
  Administered 2019-04-23: .5 mg via INTRAVENOUS

## 2019-04-23 MED ORDER — LOSARTAN POTASSIUM 25 MG PO TABS
25.0000 mg | ORAL_TABLET | Freq: Every day | ORAL | Status: DC
  Administered 2019-04-24 – 2019-04-25 (×2): 25 mg via ORAL
  Filled 2019-04-23 (×2): qty 1

## 2019-04-23 MED ORDER — ACETAMINOPHEN 160 MG/5ML PO SOLN: 325.0000 mg | ORAL | Status: DC | PRN

## 2019-04-23 MED ORDER — PROPOFOL 500 MG/50ML IV EMUL
INTRAVENOUS | Status: AC
  Filled 2019-04-23: qty 150

## 2019-04-23 SURGICAL SUPPLY — 44 items
BAG ZIPLOCK 12X15 (MISCELLANEOUS) IMPLANT
BENZOIN TINCTURE PRP APPL 2/3 (GAUZE/BANDAGES/DRESSINGS) IMPLANT
BLADE SAW SGTL 18X1.27X75 (BLADE) ×2 IMPLANT
BLADE SURG SZ10 CARB STEEL (BLADE) ×4 IMPLANT
COVER PERINEAL POST (MISCELLANEOUS) ×2 IMPLANT
COVER SURGICAL LIGHT HANDLE (MISCELLANEOUS) ×2 IMPLANT
COVER WAND RF STERILE (DRAPES) IMPLANT
CUP ACET PINNACLE SECTR 48MM (Joint) IMPLANT
DRAPE STERI IOBAN 125X83 (DRAPES) ×2 IMPLANT
DRAPE U-SHAPE 47X51 STRL (DRAPES) ×4 IMPLANT
DRESSING AQUACEL AG SP 3.5X10 (GAUZE/BANDAGES/DRESSINGS) IMPLANT
DRSG AQUACEL AG ADV 3.5X10 (GAUZE/BANDAGES/DRESSINGS) ×2 IMPLANT
DRSG AQUACEL AG SP 3.5X10 (GAUZE/BANDAGES/DRESSINGS) ×2
DURAPREP 26ML APPLICATOR (WOUND CARE) ×2 IMPLANT
ELECT REM PT RETURN 15FT ADLT (MISCELLANEOUS) ×2 IMPLANT
GAUZE XEROFORM 1X8 LF (GAUZE/BANDAGES/DRESSINGS) ×2 IMPLANT
GLOVE BIO SURGEON STRL SZ7.5 (GLOVE) ×2 IMPLANT
GLOVE BIOGEL PI IND STRL 8 (GLOVE) ×2 IMPLANT
GLOVE BIOGEL PI INDICATOR 8 (GLOVE) ×2
GLOVE ECLIPSE 8.0 STRL XLNG CF (GLOVE) ×2 IMPLANT
GOWN STRL REUS W/TWL XL LVL3 (GOWN DISPOSABLE) ×4 IMPLANT
HANDPIECE INTERPULSE COAX TIP (DISPOSABLE) ×1
HEAD FEM STD 32X+1 STRL (Hips) ×1 IMPLANT
HOLDER FOLEY CATH W/STRAP (MISCELLANEOUS) ×3 IMPLANT
KIT TURNOVER KIT A (KITS) IMPLANT
LINER ACET 32X48 (Liner) ×1 IMPLANT
PACK ANTERIOR HIP CUSTOM (KITS) ×2 IMPLANT
PINNSECTOR W/GRIP ACE CUP 48MM (Joint) ×2 IMPLANT
SCREW 6.5MMX25MM (Screw) ×1 IMPLANT
SET HNDPC FAN SPRY TIP SCT (DISPOSABLE) ×1 IMPLANT
SPONGE LAP 18X18 RF (DISPOSABLE) ×1 IMPLANT
STAPLER VISISTAT 35W (STAPLE) ×1 IMPLANT
STEM FEM SZ3 STD ACTIS (Stem) ×1 IMPLANT
STRIP CLOSURE SKIN 1/2X4 (GAUZE/BANDAGES/DRESSINGS) IMPLANT
SUT ETHIBOND NAB CT1 #1 30IN (SUTURE) ×2 IMPLANT
SUT ETHILON 2 0 PS N (SUTURE) IMPLANT
SUT MNCRL AB 4-0 PS2 18 (SUTURE) IMPLANT
SUT VIC AB 0 CT1 36 (SUTURE) ×2 IMPLANT
SUT VIC AB 1 CT1 36 (SUTURE) ×2 IMPLANT
SUT VIC AB 2-0 CT1 27 (SUTURE) ×2
SUT VIC AB 2-0 CT1 TAPERPNT 27 (SUTURE) ×2 IMPLANT
TRAY FOLEY MTR SLVR 14FR STAT (SET/KITS/TRAYS/PACK) ×1 IMPLANT
TRAY FOLEY MTR SLVR 16FR STAT (SET/KITS/TRAYS/PACK) ×1 IMPLANT
YANKAUER SUCT BULB TIP 10FT TU (MISCELLANEOUS) ×2 IMPLANT

## 2019-04-23 NOTE — Evaluation (Signed)
Physical Therapy Evaluation Patient Details Name: Jane Vasquez MRN: [id number] DOB: 09/17/1945 Today's Date: 04/23/2019   History of Present Illness  Pt s/p R THR   Clinical Impression  Pt s/p R THR and presents with decreased R LE strength/ROM and post op pain limiting functional mobility.  Pt should progress to dc to daughter's home with family assist.    Follow Up Recommendations Home health PT;Follow surgeon's recommendation for DC plan and follow-up therapies    Equipment Recommendations  Rolling walker with 5" wheels(Youth level RW)    Recommendations for Other Services       Precautions / Restrictions Precautions Precautions: Fall Restrictions Weight Bearing Restrictions: No Other Position/Activity Restrictions: WBAT      Mobility  Bed Mobility Overal bed mobility: Needs Assistance Bed Mobility: Supine to Sit     Supine to sit: Min assist;Mod assist     General bed mobility comments: cues for sequence and use of L LE to self assist  Transfers Overall transfer level: Needs assistance Equipment used: Rolling walker (2 wheeled) Transfers: Sit to/from Stand Sit to Stand: Mod assist;From elevated surface         General transfer comment: cues for LE management and use of UEs to self assist  Ambulation/Gait Ambulation/Gait assistance: Min assist;Mod assist Gait Distance (Feet): 18 Feet Assistive device: Rolling walker (2 wheeled) Gait Pattern/deviations: Step-to pattern;Decreased step length - right;Decreased step length - left;Shuffle;Trunk flexed Gait velocity: decr   General Gait Details: cues for sequence, posture and position from [employer]            Wheelchair Mobility    Modified Rankin (Stroke Patients Only)       Balance Overall balance assessment: Needs assistance Sitting-balance support: Single extremity supported;Feet supported Sitting balance-Leahy Scale: Fair     Standing balance support: Bilateral upper extremity  supported Standing balance-Leahy Scale: Poor                               Pertinent Vitals/Pain Pain Assessment: 0-10 Pain Score: 7  Pain Location: R hip Pain Descriptors / Indicators: Aching;Sore Pain Intervention(s): Limited activity within patient's tolerance;Monitored during session;Premedicated before session;Ice applied    Home Living Family/patient expects to be discharged to:: Private residence Living Arrangements: Spouse/significant other Available Help at Discharge: Family Type of Home: House Home Access: Stairs to enter Entrance Stairs-Rails: None Entrance Stairs-Number of Steps: 2 Home Layout: Two level;Able to live on main level with bedroom/bathroom Home Equipment: None      Prior Function Level of Independence: Independent               Hand Dominance        Extremity/Trunk Assessment   Upper Extremity Assessment Upper Extremity Assessment: Overall WFL for tasks assessed    Lower Extremity Assessment Lower Extremity Assessment: RLE deficits/detail       Communication   Communication: No difficulties  Cognition Arousal/Alertness: Awake/alert Behavior During Therapy: WFL for tasks assessed/performed Overall Cognitive Status: Within Functional Limits for tasks assessed                                        General Comments      Exercises Total Joint Exercises Ankle Circles/Pumps: AROM;Both;15 reps;Supine   Assessment/Plan    PT Assessment Patient needs continued PT services  PT Problem List Decreased strength;Decreased  range of motion;Decreased activity tolerance;Decreased balance;Decreased mobility;Decreased knowledge of use of DME;Pain       PT Treatment Interventions DME instruction;Gait training;Stair training;Functional mobility training;Therapeutic activities;Therapeutic exercise;Patient/family education    PT Goals (Current goals can be found in the Care Plan section)  Acute Rehab PT  Goals Patient Stated Goal: Regain IND PT Goal Formulation: With patient Time For Goal Achievement: 04/30/19 Potential to Achieve Goals: Good    Frequency 7X/week   Barriers to discharge        Co-evaluation               AM-PAC PT "6 Clicks" Mobility  Outcome Measure Help needed turning from your back to your side while in a flat bed without using bedrails?: A Lot Help needed moving from lying on your back to sitting on the side of a flat bed without using bedrails?: A Lot Help needed moving to and from a bed to a chair (including a wheelchair)?: A Lot Help needed standing up from a chair using your arms (e.g., wheelchair or bedside chair)?: A Lot Help needed to walk in hospital room?: A Lot Help needed climbing 3-5 steps with a railing? : Total 6 Click Score: 11    End of Session Equipment Utilized During Treatment: Gait belt Activity Tolerance: Patient tolerated treatment well;Patient limited by pain Patient left: in chair;with call bell/phone within reach;with chair alarm set;with family/visitor present Nurse Communication: Mobility status PT Visit Diagnosis: Difficulty in walking, not elsewhere classified (R26.2)    Time: 7893-8101 PT Time Calculation (min) (ACUTE ONLY): 30 min   Charges:   PT Evaluation $PT Eval Low Complexity: 1 Low PT Treatments $Gait Training: 8-22 mins        Mauro Kaufmann PT Acute Rehabilitation Services Pager [phone] Office [phone]   Gracie Gupta 04/23/2019, 6:44 PM

## 2019-04-23 NOTE — Anesthesia Procedure Notes (Signed)
Spinal  Patient location during procedure: OR Start time: 04/23/2019 8:21 AM End time: 04/23/2019 8:26 AM Staffing Anesthesiologist: Janeece Riggers, MD Preanesthetic Checklist Completed: patient identified, site marked, surgical consent, pre-op evaluation, timeout performed, IV checked, risks and benefits discussed and monitors and equipment checked Spinal Block Patient position: sitting Prep: DuraPrep Patient monitoring: heart rate, cardiac monitor, continuous pulse ox and blood pressure Approach: midline Location: L3-4 Injection technique: single-shot Needle Needle type: Sprotte  Needle gauge: 24 G Needle length: 9 cm Assessment Sensory level: T4

## 2019-04-23 NOTE — Transfer of Care (Signed)
Immediate Anesthesia Transfer of Care Note  Patient: Jane Vasquez  Procedure(s) Performed: Procedure(s): RIGHT TOTAL HIP ARTHROPLASTY ANTERIOR APPROACH (Right)  Patient Location: PACU  Anesthesia Type:Spinal  Level of Consciousness:  sedated, patient cooperative and responds to stimulation  Airway & Oxygen Therapy:Patient Spontanous Breathing and Patient connected to face mask oxgen  Post-op Assessment:  Report given to PACU RN and Post -op Vital signs reviewed and stable  Post vital signs:  Reviewed and stable  Last Vitals:  Vitals:   04/23/19 0634  BP: 127/66  Pulse: 84  Resp: 15  Temp: 36.9 C  SpO2: 23%    Complications: No apparent anesthesia complications

## 2019-04-23 NOTE — Op Note (Signed)
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  PROCEDURE:  Right total hip arthroplasty, direct anterior approach.  IMPLANTS:  DePuy Sector Gription acetabular component size 48 with a single screw, size 32+0 neutral polyethylene liner, size 3 Actis femoral component with standard offset, size 32+1 metal hip ball.  SURGEON:  Lind Guest. Ninfa Linden, MD  ASSISTANT:  Erskine Emery, PA-C.  ANESTHESIA:  Spinal.  ANTIBIOTICS:  900 mg IV clindamycin.  ESTIMATED BLOOD LOSS:  100 mL.  COMPLICATIONS:  None.  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  At this point, her pain has become daily.  We tried a  series of injections in her hip.  She walks with a Trendelenburg gait and that right side is slightly shorter than the left.  At this point, her pain is significant to the point that she does wish to proceed with total hip arthroplasty.  I talked about  the risks of acute blood loss anemia, nerve and vessel injury, fracture, infection, DVT, dislocation, implant failure.  Talked about the goals being decreased pain, improved mobility, and overall improve quality of life.  DESCRIPTION OF PROCEDURE:  After informed consent was obtained appropriate right hip was marked.  She was brought to the operating room and sat up on a stretcher where spinal anesthesia was obtained.  I then had her lay back supine on the stretcher.   Foley catheter was placed and I was able to really assess her  leg lengths then and found her definitely to be a little shorter on the right than the left.  We then placed her supine on the operative table with perineal post in place k both legs in  in-line skeletal traction device and no traction applied.  I did obtain preoperative x-rays as well, so I could get a good idea of what her leg length and offset is and how to hopefully match that anatomy with lengthening her and give her a little bit  more offset.  We then prepped the right hip with DuraPrep and sterile drapes.  A time-out was called and she was identified as correct patient, correct right hip.  I then made an incision just inferior and posterior to the anterior superior iliac spine  and carried this obliquely down the leg.  We dissected down tensor fascia lata muscle.  The tensor fascia was then divided longitudinally to proceed with direct anterior approach to the hip.  We identified and cauterized circumflex vessels and identified  the hip capsule.  The hip capsule in an L-type format, finding moderate joint effusion and you could see that the femoral head was sitting deep into the acetabulum.  I then opened up the hip capsule in an L-type format, placed the Cobra retractor around  the medial and lateral femoral neck.  I then made our femoral neck cut just proximal to the lesser trochanter and completed this with an osteotome.  We placed a corkscrew guide in the femoral head and removed the femoral head in its entirety and found  it to be devoid of cartilage.  Also found significant sclerosis  in the acetabulum.  You could see it was deep so we knew that we were going for a rim fit on the acetabular component.  I then placed a bent Hohmann over the medial acetabular rim and reamed  gently from a size 43 reamer in stepwise increments up to a size 47 with all reamers under direct visualization, the last reamer under direct fluoroscopy, so I could obtain by depth of reaming and inclination and anteversion.  I  then placed the real  DePuy Sector Gription acetabular component size 48 and I did place a single screw given the rim fit of the acetabular component.  It did feel solid.  I then placed a 32+0 neutral polyethylene liner for that size acetabular component.  Attention was then  turned to the femur.  With the leg externally rotated to 120 degrees, extended and adducted, we were placing Mueller retractor medially and Hohman retractor behind the greater trochanter.  We released lateral joint capsule and used a box-cutting  osteotome to enter the femoral canal and a rongeur to lateralize then.  Began broaching using the Actis broaching system from the starter broach then up to a size 3 in stepwise increments.  With the size 3 in place, we trialed a standard offset femoral  neck and a 32+1 hip ball.  We reduced this in the acetabulum and I was pleased with her leg length, offset, range of motion, and stability assessed mechanically and radiographically.  We then dislocated the hip and removed the trial components.  We  placed the real Actis standard offset femoral component, size 3 and the real 32+1 metal hip ball again reduced this in the acetabulum and it was stable.  We then irrigated the soft tissue with normal saline solution using pulsatile lavage.  We closed the  joint capsule with interrupted #1 Ethibond suture.  We closed tensor fascia with #1 Vicryl followed by closing the deep tissue with 0 Vicryl, closing the subcutaneous tissue with 2-0 Vicryl, and we did place staples on the skin.  Xeroform and Aquacel  dressing was applied.  She was taken off the Hana table and taken to recovery room in stable condition.  All final counts were correct.  There were no complications noted.    Of note, Richardean Canal, PA-C, assisted during the entire case.  His assistance was crucial for facilitating all aspects of this case.  CN/NUANCE  D:04/23/2019 T:04/23/2019 JOB:[id number]

## 2019-04-23 NOTE — Brief Op Note (Signed)
04/23/2019  9:37 AM  PATIENT:  Jane Vasquez  73 y.o. female  PRE-OPERATIVE DIAGNOSIS:  osteoarthritis/degenerative joint disease/protrusion  POST-OPERATIVE DIAGNOSIS:  osteoarthritis/degenerative joint disease/protrusion  PROCEDURE:  Procedure(s): RIGHT TOTAL HIP ARTHROPLASTY ANTERIOR APPROACH (Right)  SURGEON:  Surgeon(s) and Role:    * Mcarthur Rossetti, MD - Primary  PHYSICIAN ASSISTANT: Benita Stabile, PA-C   ANESTHESIA:   spinal  EBL:  200 mL   COUNTS:  YES  DICTATION: .Other Dictation: Dictation Number [id number]  PLAN OF CARE: Admit to inpatient   PATIENT DISPOSITION:  PACU - hemodynamically stable.   Delay start of Pharmacological VTE agent (>24hrs) due to surgical blood loss or risk of bleeding: no

## 2019-04-23 NOTE — H&P (Signed)
There has been no acute changes in the patient's medical status.  She understands that she is having a right total hip arthroplasty.  The risks and benefits of surgery have been discussed and informed consent is obtained.

## 2019-04-24 DIAGNOSIS — I10 Essential (primary) hypertension: Secondary | ICD-10-CM | POA: Diagnosis present

## 2019-04-24 DIAGNOSIS — Z9049 Acquired absence of other specified parts of digestive tract: Secondary | ICD-10-CM | POA: Diagnosis not present

## 2019-04-24 DIAGNOSIS — Z7982 Long term (current) use of aspirin: Secondary | ICD-10-CM | POA: Diagnosis not present

## 2019-04-24 DIAGNOSIS — M109 Gout, unspecified: Secondary | ICD-10-CM | POA: Diagnosis present

## 2019-04-24 DIAGNOSIS — D62 Acute posthemorrhagic anemia: Secondary | ICD-10-CM | POA: Diagnosis not present

## 2019-04-24 DIAGNOSIS — Z880 Allergy status to penicillin: Secondary | ICD-10-CM | POA: Diagnosis not present

## 2019-04-24 DIAGNOSIS — Z79899 Other long term (current) drug therapy: Secondary | ICD-10-CM | POA: Diagnosis not present

## 2019-04-24 DIAGNOSIS — Z79891 Long term (current) use of opiate analgesic: Secondary | ICD-10-CM | POA: Diagnosis not present

## 2019-04-24 DIAGNOSIS — M1611 Unilateral primary osteoarthritis, right hip: Secondary | ICD-10-CM | POA: Diagnosis present

## 2019-04-24 DIAGNOSIS — Z882 Allergy status to sulfonamides status: Secondary | ICD-10-CM | POA: Diagnosis not present

## 2019-04-24 LAB — BASIC METABOLIC PANEL
Anion gap: 10 (ref 5–15)
BUN: 13 mg/dL (ref 8–23)
CO2: 26 mmol/L (ref 22–32)
Calcium: 9.2 mg/dL (ref 8.9–10.3)
Chloride: 103 mmol/L (ref 98–111)
Creatinine, Ser: 0.58 mg/dL (ref 0.44–1.00)
GFR calc Af Amer: >60 mL/min (ref >60)
GFR calc non Af Amer: >60 mL/min (ref >60)
Glucose, Bld: 131 mg/dL — ABNORMAL HIGH (ref 70–99)
Potassium: 3.1 mmol/L — ABNORMAL LOW (ref 3.5–5.1)
Sodium: 139 mmol/L (ref 135–145)

## 2019-04-24 LAB — CBC
HCT: 26 % — ABNORMAL LOW (ref 36.0–46.0)
Hemoglobin: 8.8 g/dL — ABNORMAL LOW (ref 12.0–15.0)
MCH: 26.9 pg (ref 26.0–34.0)
MCHC: 33.8 g/dL (ref 30.0–36.0)
MCV: 79.5 fL — ABNORMAL LOW (ref 80.0–100.0)
Platelets: 246 10*3/uL (ref 150–400)
RBC: 3.27 MIL/uL — ABNORMAL LOW (ref 3.87–5.11)
RDW: 13.4 % (ref 11.5–15.5)
WBC: 9.4 10*3/uL (ref 4.0–10.5)
nRBC: 0 % (ref 0.0–0.2)

## 2019-04-24 MED ORDER — HYDROCODONE-ACETAMINOPHEN 7.5-325 MG PO TABS: 1.0000 | ORAL_TABLET | Freq: Four times a day (QID) | ORAL | 0 refills | Status: DC | PRN

## 2019-04-24 MED ORDER — ASPIRIN 81 MG PO CHEW: 81.0000 mg | CHEWABLE_TABLET | Freq: Two times a day (BID) | ORAL | 0 refills | Status: DC

## 2019-04-24 NOTE — Progress Notes (Signed)
Physical Therapy Treatment Patient Details Name: Jane Vasquez MRN: [id number] DOB: 13-Jun-1946 Today's Date: 04/24/2019    History of Present Illness Pt s/p R THR     PT Comments    Pt continues very cooperative but ltd this pm by increased pain.   Follow Up Recommendations  Home health PT;Follow surgeon's recommendation for DC plan and follow-up therapies     Equipment Recommendations  Rolling walker with 5" wheels    Recommendations for Other Services       Precautions / Restrictions Precautions Precautions: Fall Restrictions Weight Bearing Restrictions: No Other Position/Activity Restrictions: WBAT    Mobility  Bed Mobility               General bed mobility comments: up in chair and request back to same  Transfers Overall transfer level: Needs assistance Equipment used: Rolling walker (2 wheeled) Transfers: Sit to/from Stand Sit to Stand: Min assist         General transfer comment: cues for LE management and use of UEs to self assist  Ambulation/Gait Ambulation/Gait assistance: Min assist Gait Distance (Feet): 70 Feet Assistive device: Rolling walker (2 wheeled) Gait Pattern/deviations: Step-to pattern;Decreased step length - right;Decreased step length - left;Shuffle;Trunk flexed Gait velocity: decr   General Gait Details: cues for sequence, posture and position from [employer]             Wheelchair Mobility    Modified Rankin (Stroke Patients Only)       Balance Overall balance assessment: Needs assistance Sitting-balance support: No upper extremity supported Sitting balance-Leahy Scale: Good     Standing balance support: Bilateral upper extremity supported Standing balance-Leahy Scale: Poor                              Cognition Arousal/Alertness: Awake/alert Behavior During Therapy: WFL for tasks assessed/performed Overall Cognitive Status: Within Functional Limits for tasks assessed                                         Exercises Total Joint Exercises Ankle Circles/Pumps: AROM;Both;15 reps;Supine Quad Sets: AROM;Both;10 reps;Supine Heel Slides: AAROM;Right;15 reps;Supine Hip ABduction/ADduction: AAROM;Right;15 reps;Supine    General Comments        Pertinent Vitals/Pain Pain Assessment: 0-10 Pain Score: 6  Pain Location: R hip Pain Descriptors / Indicators: Aching;Sore;Grimacing Pain Intervention(s): Limited activity within patient's tolerance;Monitored during session;Premedicated before session;Ice applied    Home Living                      Prior Function            PT Goals (current goals can now be found in the care plan section) Acute Rehab PT Goals Patient Stated Goal: Regain IND PT Goal Formulation: With patient Time For Goal Achievement: 04/30/19 Potential to Achieve Goals: Good Progress towards PT goals: Progressing toward goals    Frequency    7X/week      PT Plan Current plan remains appropriate    Co-evaluation              AM-PAC PT "6 Clicks" Mobility   Outcome Measure  Help needed turning from your back to your side while in a flat bed without using bedrails?: A Lot Help needed moving from lying on your back to sitting on the side  of a flat bed without using bedrails?: A Lot Help needed moving to and from a bed to a chair (including a wheelchair)?: A Lot Help needed standing up from a chair using your arms (e.g., wheelchair or bedside chair)?: A Lot Help needed to walk in hospital room?: A Little Help needed climbing 3-5 steps with a railing? : Total 6 Click Score: 12    End of Session Equipment Utilized During Treatment: Gait belt Activity Tolerance: Patient tolerated treatment well;Patient limited by pain Patient left: in chair;with call bell/phone within reach;with family/visitor present Nurse Communication: Mobility status PT Visit Diagnosis: Difficulty in walking, not elsewhere classified (R26.2)      Time: 1320-1350 PT Time Calculation (min) (ACUTE ONLY): 30 min  Charges:  $Gait Training: 8-22 mins $Therapeutic Exercise: 8-22 mins                     [place] Pager [phone] Office [phone]    Zonie Crutcher 04/24/2019, 5:06 PM

## 2019-04-24 NOTE — Progress Notes (Signed)
    Home health agencies that serve [id number].        [place] Quality of Patient Care Rating Patient Survey Summary Rating  [facility] [phone] 4 out of 5 stars 3 out of [place] [phone] 3 out of 5 stars 4 out of [place] [phone] 3  out of 5 stars 4 out of [place] [phone]) [phone] 3 out of 5 stars 3 out of [place] [phone] 4 out of 5 stars 5 out of [place] number Footnote as displayed on [place]  1 This agency provides services under a federal waiver program to non-traditional, chronic long term population.  2 This agency provides services to a special needs population.  3 Not Available.  4 The number of patient episodes for this measure is too small to report.  5 This measure currently does not have data or provider has been certified/recertified for less than 6 months.  6 The national average for this measure is not provided because of state-to-state differences in data collection.  7 Medicare is not displaying rates for this measure for any home health agency, because of an issue with the data.  8 There were problems with the data and they are being corrected.  9 Zero, or very few, patients met the survey's rules for inclusion. The scores shown, if any, reflect a very small number of surveys and may not accurately tell how an agency is doing.  10 Survey results are based on less than 12 months of data.  11 Fewer than 70 patients completed the survey. Use the scores shown, if any, with caution as the number of surveys may be too low to accurately tell how an agency is doing.  12 No survey results are available for this period.  13 Data suppressed by CMS for one or more quarters.

## 2019-04-24 NOTE — Progress Notes (Signed)
Subjective: 1 Day Post-Op Procedure(s) (LRB): RIGHT TOTAL HIP ARTHROPLASTY ANTERIOR APPROACH (Right) Patient reports pain as moderate.  Working slowly with therapy.  Acute blood loss anemia from surgery, but tolerating well.  Objective: Vital signs in last 24 hours: Temp:  [97.7 F (36.5 C)-98.8 F (37.1 C)] 98.4 F (36.9 C) (10/24 0937) Pulse Rate:  [58-81] 81 (10/24 0937) Resp:  [14-19] 15 (10/24 0937) BP: (107-128)/(58-65) 112/62 (10/24 0937) SpO2:  [98 %-100 %] 100 % (10/24 0937)  Intake/Output from previous day: 10/23 0701 - 10/24 0700 In: 3371.8 [P.O.:540; I.V.:2831.8] Out: 4001 [Urine:3801; Blood:200] Intake/Output this shift: Total I/O In: 591.3 [P.O.:480; I.V.:111.3] Out: -   Recent Labs    04/24/19 0319  HGB 8.8*   Recent Labs    04/24/19 0319  WBC 9.4  RBC 3.27*  HCT 26.0*  PLT 246   Recent Labs    04/24/19 0319  NA 139  K 3.1*  CL 103  CO2 26  BUN 13  CREATININE 0.58  GLUCOSE 131*  CALCIUM 9.2   No results for input(s): LABPT, INR in the last 72 hours.  Sensation intact distally Intact pulses distally Dorsiflexion/Plantar flexion intact Incision: dressing C/D/I   Assessment/Plan: 1 Day Post-Op Procedure(s) (LRB): RIGHT TOTAL HIP ARTHROPLASTY ANTERIOR APPROACH (Right) Up with therapy Plan for discharge tomorrow Discharge home with home health if looks good, vitals are stable and H&H stable.      Mcarthur Rossetti 04/24/2019, 10:36 AM

## 2019-04-24 NOTE — TOC Progression Note (Signed)
Transition of Care [facility]) - Progression Note    Patient Details  Name: Jane Vasquez MRN: [id number] Date of Birth: 1945-09-07  Transition of Care [facility]) CM/SW Contact  Joaquin Courts, RN Phone Number: 04/24/2019, 11:58 AM  Clinical Narrative:    CM spoke with patient at bedside. Patient set up with [facility] at home for [place]. [employer] to deliver rolling walker and 3-in-1 to bedside for home use.   Expected Discharge Plan: [place] Barriers to Discharge: Continued Medical Work up  Expected Discharge Plan and Services Expected Discharge Plan: [place]   Discharge Planning Services: CM Consult Post Acute Care Choice: [place] arrangements for the past 2 months: Single Family Home                 DME Arranged: 3-N-1, Walker rolling DME Agency: [employer] Date DME Agency Contacted: 04/24/19 Time DME Agency Contacted: [phone] Representative spoke with at DME Agency: [place]: PT [place]: [facility] [employer] [facility] [employer]     Representative spoke with at [place]: pre arranged in MD office   Social Determinants of Health ([place]) Interventions    Readmission Risk Interventions No flowsheet data found.

## 2019-04-24 NOTE — Discharge Instructions (Signed)

## 2019-04-24 NOTE — Plan of Care (Signed)
  Problem: Clinical Measurements: Goal: Will remain free from infection Outcome: Progressing   Problem: Clinical Measurements: Goal: Diagnostic test results will improve Outcome: Progressing   Problem: Clinical Measurements: Goal: Respiratory complications will improve Outcome: Progressing   Problem: Clinical Measurements: Goal: Cardiovascular complication will be avoided Outcome: Progressing   Problem: Coping: Goal: Level of anxiety will decrease Outcome: Progressing   

## 2019-04-24 NOTE — Progress Notes (Signed)
Patient ID: Jane Vasquez, female   DOB: [birth year], 73 y.o.   MRN: [id number] It is not safe for the patient to be discharged to home today due to the need for additional PT given her weakness and being a fall risk.  Also, we need to continue to monitor her vitals given her acute blood loss anemia and the need to check a CBC again tomorrow.  If she progresses well, can be discharged to home tomorrow.

## 2019-04-24 NOTE — Progress Notes (Signed)
Physical Therapy Treatment Patient Details Name: Jane Vasquez MRN: [id number] DOB: 08/31/1945 Today's Date: 04/24/2019    History of Present Illness Pt s/p R THR     PT Comments    Pt very cooperative and with noted increased activity tolerance but continues to require significant assist to move sit to stand.   Follow Up Recommendations  Home health PT;Follow surgeon's recommendation for DC plan and follow-up therapies     Equipment Recommendations  Rolling walker with 5" wheels    Recommendations for Other Services       Precautions / Restrictions Precautions Precautions: Fall Restrictions Weight Bearing Restrictions: No Other Position/Activity Restrictions: WBAT    Mobility  Bed Mobility               General bed mobility comments: up in chair and request back to same  Transfers Overall transfer level: Needs assistance Equipment used: Rolling walker (2 wheeled) Transfers: Sit to/from Stand Sit to Stand: Mod assist         General transfer comment: cues for LE management and use of UEs to self assist  Ambulation/Gait Ambulation/Gait assistance: Min assist Gait Distance (Feet): 70 Feet Assistive device: Rolling walker (2 wheeled) Gait Pattern/deviations: Step-to pattern;Decreased step length - right;Decreased step length - left;Shuffle;Trunk flexed Gait velocity: decr   General Gait Details: cues for sequence, posture and position from [employer]             Wheelchair Mobility    Modified Rankin (Stroke Patients Only)       Balance Overall balance assessment: Needs assistance Sitting-balance support: No upper extremity supported Sitting balance-Leahy Scale: Good     Standing balance support: Bilateral upper extremity supported Standing balance-Leahy Scale: Poor                              Cognition Arousal/Alertness: Awake/alert Behavior During Therapy: WFL for tasks assessed/performed Overall Cognitive Status:  Within Functional Limits for tasks assessed                                        Exercises      General Comments        Pertinent Vitals/Pain Pain Assessment: 0-10 Pain Score: 3  Pain Location: R hip Pain Descriptors / Indicators: Aching;Sore Pain Intervention(s): Limited activity within patient's tolerance;Monitored during session;Premedicated before session;Ice applied    Home Living                      Prior Function            PT Goals (current goals can now be found in the care plan section) Acute Rehab PT Goals Patient Stated Goal: Regain IND PT Goal Formulation: With patient Time For Goal Achievement: 04/30/19 Potential to Achieve Goals: Good Progress towards PT goals: Progressing toward goals    Frequency    7X/week      PT Plan Current plan remains appropriate    Co-evaluation              AM-PAC PT "6 Clicks" Mobility   Outcome Measure  Help needed turning from your back to your side while in a flat bed without using bedrails?: A Lot Help needed moving from lying on your back to sitting on the side of a flat bed without using bedrails?: A  Lot Help needed moving to and from a bed to a chair (including a wheelchair)?: A Lot Help needed standing up from a chair using your arms (e.g., wheelchair or bedside chair)?: A Lot Help needed to walk in hospital room?: A Little Help needed climbing 3-5 steps with a railing? : Total 6 Click Score: 12    End of Session Equipment Utilized During Treatment: Gait belt Activity Tolerance: Patient tolerated treatment well Patient left: in chair;with call bell/phone within reach;with chair alarm set;with family/visitor present Nurse Communication: Mobility status PT Visit Diagnosis: Difficulty in walking, not elsewhere classified (R26.2)     Time: 1194-1740 PT Time Calculation (min) (ACUTE ONLY): 20 min  Charges:  $Gait Training: 8-22 mins                     [place] Pager [phone] Office [phone]    Laney Louderback 04/24/2019, 12:59 PM

## 2019-04-25 LAB — CBC
HCT: 23.6 % — ABNORMAL LOW (ref 36.0–46.0)
Hemoglobin: 8.1 g/dL — ABNORMAL LOW (ref 12.0–15.0)
MCH: 27.1 pg (ref 26.0–34.0)
MCHC: 34.3 g/dL (ref 30.0–36.0)
MCV: 78.9 fL — ABNORMAL LOW (ref 80.0–100.0)
Platelets: 239 10*3/uL (ref 150–400)
RBC: 2.99 MIL/uL — ABNORMAL LOW (ref 3.87–5.11)
RDW: 13.4 % (ref 11.5–15.5)
WBC: 9.3 10*3/uL (ref 4.0–10.5)
nRBC: 0 % (ref 0.0–0.2)

## 2019-04-25 MED ORDER — METHOCARBAMOL 500 MG PO TABS: 500.0000 mg | ORAL_TABLET | Freq: Three times a day (TID) | ORAL | 0 refills | Status: DC

## 2019-04-25 MED ORDER — TRAMADOL HCL 50 MG PO TABS: 50.0000 mg | ORAL_TABLET | Freq: Four times a day (QID) | ORAL | 0 refills | Status: DC | PRN

## 2019-04-25 NOTE — Progress Notes (Signed)
Pt stable at time of discharge. No needs at time of discharge. Pt left all needed home equipment. Pt dressing wnl. Pt to follow up with md.

## 2019-04-25 NOTE — Progress Notes (Signed)
Physical Therapy Treatment Patient Details Name: Jane Vasquez MRN: [id number] DOB: [birth year] Today's Date: 04/25/2019    History of Present Illness Pt s/p R THR     PT Comments    Pt continues motivated and progressing steadily with mobility.  Pt dtr present this am to review home therex, stairs and car transfers.   Follow Up Recommendations  Home health PT;Follow surgeon's recommendation for DC plan and follow-up therapies     Equipment Recommendations  Rolling walker with 5" wheels    Recommendations for Other Services       Precautions / Restrictions Precautions Precautions: Fall Restrictions Weight Bearing Restrictions: No Other Position/Activity Restrictions: WBAT    Mobility  Bed Mobility               General bed mobility comments: up in chair and request back to same; pt reports minimal difficulty with bed mobility  Transfers Overall transfer level: Needs assistance Equipment used: Rolling walker (2 wheeled) Transfers: Sit to/from Stand Sit to Stand: Min guard         General transfer comment: cues for LE management and use of UEs to self assist  Ambulation/Gait Ambulation/Gait assistance: Min guard;Supervision Gait Distance (Feet): 75 Feet Assistive device: Rolling walker (2 wheeled) Gait Pattern/deviations: Step-to pattern;Decreased step length - right;Decreased step length - left;Shuffle Gait velocity: decr   General Gait Details: cues for sequence, posture and position from RW   Stairs Stairs: Yes Stairs assistance: Min assist Stair Management: No rails;Step to pattern;Backwards;With walker Number of Stairs: 2 General stair comments: cues for sequence and foot/RW placement; dtr present and assisting and written instruction provided.   Wheelchair Mobility    Modified Rankin (Stroke Patients Only)       Balance Overall balance assessment: Needs assistance Sitting-balance support: No upper extremity supported Sitting  balance-Leahy Scale: Good     Standing balance support: Bilateral upper extremity supported Standing balance-Leahy Scale: Fair                              Cognition Arousal/Alertness: Awake/alert Behavior During Therapy: WFL for tasks assessed/performed Overall Cognitive Status: Within Functional Limits for tasks assessed                                        Exercises Total Joint Exercises Ankle Circles/Pumps: AROM;Both;15 reps;Supine Quad Sets: AROM;Both;10 reps;Supine Heel Slides: AAROM;Right;15 reps;Supine Hip ABduction/ADduction: AAROM;Right;15 reps;Supine    General Comments        Pertinent Vitals/Pain Pain Assessment: 0-10 Pain Score: 5  Pain Location: R hip/thigh Pain Descriptors / Indicators: Sore;Grimacing;Burning Pain Intervention(s): Limited activity within patient's tolerance;Monitored during session;Premedicated before session;Ice applied    Home Living                      Prior Function            PT Goals (current goals can now be found in the care plan section) Acute Rehab PT Goals Patient Stated Goal: Regain IND PT Goal Formulation: With patient Time For Goal Achievement: 04/30/19 Potential to Achieve Goals: Good Progress towards PT goals: Progressing toward goals    Frequency    7X/week      PT Plan Current plan remains appropriate    Co-evaluation              AM-PAC  PT "6 Clicks" Mobility   Outcome Measure  Help needed turning from your back to your side while in a flat bed without using bedrails?: A Little Help needed moving from lying on your back to sitting on the side of a flat bed without using bedrails?: A Little Help needed moving to and from a bed to a chair (including a wheelchair)?: A Little Help needed standing up from a chair using your arms (e.g., wheelchair or bedside chair)?: A Little Help needed to walk in hospital room?: A Little Help needed climbing 3-5 steps with a  railing? : A Little 6 Click Score: 18    End of Session Equipment Utilized During Treatment: Gait belt Activity Tolerance: Patient tolerated treatment well Patient left: in chair;with call bell/phone within reach;with family/visitor present Nurse Communication: Mobility status PT Visit Diagnosis: Difficulty in walking, not elsewhere classified (R26.2)     Time: 1657-9038 PT Time Calculation (min) (ACUTE ONLY): 37 min  Charges:  $Gait Training: 8-22 mins $Therapeutic Exercise: 8-22 mins                     [place] Pager [phone] Office [phone]    Dandrae Kustra 04/25/2019, 4:31 PM

## 2019-04-25 NOTE — Discharge Summary (Signed)
Discharge Diagnoses:  Principal Problem:   Unilateral primary osteoarthritis, right hip Active Problems:   Status post total replacement of right hip   Surgeries: Procedure(s): RIGHT TOTAL HIP ARTHROPLASTY ANTERIOR APPROACH on 04/23/2019    Consultants:   Discharged Condition: Improved  Hospital Course: VENIE MONTESINOS is an 73 y.o. female who was admitted 04/23/2019 with a chief complaint of osteoarthritis right hip, with a final diagnosis of osteoarthritis/degenerative joint disease/protrusion.  Patient was brought to the operating room on 04/23/2019 and underwent Procedure(s): RIGHT TOTAL HIP ARTHROPLASTY ANTERIOR APPROACH.    Patient was given perioperative antibiotics:  Anti-infectives (From admission, onward)   Start     Dose/Rate Route Frequency Ordered Stop   04/23/19 1400  clindamycin (CLEOCIN) IVPB 600 mg     600 mg 100 mL/hr over 30 Minutes Intravenous Every 6 hours 04/23/19 1142 04/23/19 2040   04/23/19 0615  clindamycin (CLEOCIN) IVPB 900 mg     900 mg 100 mL/hr over 30 Minutes Intravenous On call to O.R. 04/23/19 4580 04/23/19 0819    .  Patient was given sequential compression devices, early ambulation, and aspirin for DVT prophylaxis.  Recent vital signs:  Patient Vitals for the past 24 hrs:  BP Temp Temp src Pulse Resp SpO2  04/25/19 0627 (!) 143/65 98.9 F (37.2 C) Oral 90 20 100 %  04/24/19 2041 127/60 98.9 F (37.2 C) Oral 85 20 99 %  04/24/19 1451 (!) 109/46 98.8 F (37.1 C) Oral 89 16 100 %  .  Recent laboratory studies: No results found.  Discharge Medications:   Allergies as of 04/25/2019      Reactions   Penicillins Hives, Rash   Did it involve swelling of the face/tongue/throat, SOB, or low BP? No Did it involve sudden or severe rash/hives, skin peeling, or any reaction on the inside of your mouth or nose? No Did you need to seek medical attention at a hospital or doctor's office? No When did it last happen? If all above answers are  "NO", may proceed with cephalosporin use.   Sulfur Hives, Rash      Medication List    STOP taking these medications   aspirin EC 81 MG tablet Replaced by: aspirin 81 MG chewable tablet     TAKE these medications   allopurinol 100 MG tablet Commonly known as: ZYLOPRIM Take 100 mg by mouth daily.   amLODipine 10 MG tablet Commonly known as: NORVASC Take 10 mg by mouth daily.   aspirin 81 MG chewable tablet Chew 1 tablet (81 mg total) by mouth 2 (two) times daily. Replaces: aspirin EC 81 MG tablet   CALCIUM 1200 PO Take 1,200 mg by mouth 2 (two) times daily.   hydrochlorothiazide 25 MG tablet Commonly known as: HYDRODIURIL Take 25 mg by mouth daily.   HYDROcodone-acetaminophen 7.5-325 MG tablet Commonly known as: NORCO Take 1-2 tablets by mouth every 6 (six) hours as needed for severe pain.   ibuprofen 800 MG tablet Commonly known as: ADVIL Take 1 tablet (800 mg total) by mouth 2 (two) times daily between meals as needed. What changed: reasons to take this   losartan 25 MG tablet Commonly known as: COZAAR Take 25 mg by mouth daily.   methocarbamol 500 MG tablet Commonly known as: Robaxin Take 1 tablet (500 mg total) by mouth 3 (three) times daily.   omega-3 acid ethyl esters 1 g capsule Commonly known as: LOVAZA Take 1 g by mouth 2 (two) times daily.   pravastatin 40 MG  tablet Commonly known as: PRAVACHOL Take 40 mg by mouth daily.   traMADol 50 MG tablet Commonly known as: ULTRAM Take 1 tablet (50 mg total) by mouth every 6 (six) hours as needed for moderate pain. What changed:   how much to take  reasons to take this            Durable Medical Equipment  (From admission, onward)         Start     Ordered   04/23/19 1143  DME 3 n 1  Once     04/23/19 1142   04/23/19 1143  DME Walker rolling  Once    Question:  Patient needs a walker to treat with the following condition  Answer:  Status post total replacement of right hip   04/23/19 1142           Diagnostic Studies: Dg Pelvis Portable  Result Date: 04/23/2019 CLINICAL DATA:  Post right hip replacement EXAM: PORTABLE PELVIS 1-2 VIEWS COMPARISON:  06/25/2018 FINDINGS: Changes of right hip replacement. Normal AP alignment. No hardware bony complicating feature. IMPRESSION: Right hip replacement.  No visible complicating feature. Electronically Signed   By: Charlett Nose M.D.   On: 04/23/2019 10:19   Dg C-arm 1-60 Min-no Report  Result Date: 04/23/2019 Fluoroscopy was utilized by the requesting physician.  No radiographic interpretation.   Dg Hip Operative Unilat W Or W/o Pelvis Right  Result Date: 04/23/2019 CLINICAL DATA:  Intraoperative right hip replacement EXAM: OPERATIVE RIGHT HIP (WITH PELVIS IF PERFORMED) to VIEWS TECHNIQUE: Fluoroscopic spot image(s) were submitted for interpretation post-operatively. COMPARISON:  06/25/2018 FINDINGS: Changes of right hip replacement. Normal AP alignment. No hardware bony complicating feature. IMPRESSION: Right hip replacement.  No visible complicating feature. Electronically Signed   By: Charlett Nose M.D.   On: 04/23/2019 09:37    Patient benefited maximally from their hospital stay and there were no complications.     Disposition: Discharge disposition: 01-Home or Self Care      Discharge Instructions    Call MD / Call 911   Complete by: As directed    If you experience chest pain or shortness of breath, CALL 911 and be transported to the hospital emergency room.  If you develope a fever above 101 F, pus (white drainage) or increased drainage or redness at the wound, or calf pain, call your surgeon's office.   Constipation Prevention   Complete by: As directed    Drink plenty of fluids.  Prune juice may be helpful.  You may use a stool softener, such as Colace (over the counter) 100 mg twice a day.  Use MiraLax (over the counter) for constipation as needed.   Diet - low sodium heart healthy   Complete by: As directed     Increase activity slowly as tolerated   Complete by: As directed      Follow-up Information    [facility] Follow up.   Specialty: Home Health Services Why: agency will provide home health physical therapy, agency will call you to schedule first visit.  Contact information: [address] [place] [address] [phone]        Kathryne Hitch, MD Follow up in 1 week(s).   Specialty: Orthopedic Surgery Contact information: [address] [place] [address] [phone]            Signed: Nadara Mustard 04/25/2019, 1:57 PM

## 2019-04-25 NOTE — Plan of Care (Signed)
  Problem: Clinical Measurements: Goal: Respiratory complications will improve Outcome: Progressing   Problem: Clinical Measurements: Goal: Cardiovascular complication will be avoided Outcome: Progressing   Problem: Activity: Goal: Ability to avoid complications of mobility impairment will improve Outcome: Progressing   Problem: Activity: Goal: Ability to tolerate increased activity will improve Outcome: Progressing

## 2019-04-25 NOTE — Anesthesia Postprocedure Evaluation (Signed)
Anesthesia Post Note  Patient: Jane Vasquez  Procedure(s) Performed: RIGHT TOTAL HIP ARTHROPLASTY ANTERIOR APPROACH (Right Hip)     Patient location during evaluation: PACU Anesthesia Type: MAC Level of consciousness: awake and alert Pain management: pain level controlled Vital Signs Assessment: post-procedure vital signs reviewed and stable Respiratory status: spontaneous breathing, nonlabored ventilation, respiratory function stable and patient connected to nasal cannula oxygen Cardiovascular status: stable and blood pressure returned to baseline Postop Assessment: no apparent nausea or vomiting Anesthetic complications: no    Last Vitals:  Vitals:   04/24/19 2041 04/25/19 0627  BP: 127/60 (!) 143/65  Pulse: 85 90  Resp: 20 20  Temp: 37.2 C 37.2 C  SpO2: 99% 100%    Last Pain:  Vitals:   04/25/19 0914  TempSrc:   PainSc: 3                  Osbaldo Mark

## 2019-04-25 NOTE — Plan of Care (Signed)
  Problem: Education: Goal: Knowledge of General Education information will improve Description Including pain rating scale, medication(s)/side effects and non-pharmacologic comfort measures Outcome: Progressing   Problem: Clinical Measurements: Goal: Will remain free from infection Outcome: Progressing Goal: Respiratory complications will improve Outcome: Progressing Goal: Cardiovascular complication will be avoided Outcome: Progressing   Problem: Activity: Goal: Risk for activity intolerance will decrease Outcome: Progressing   Problem: Elimination: Goal: Will not experience complications related to urinary retention Outcome: Progressing   Problem: Pain Managment: Goal: General experience of comfort will improve Outcome: Progressing   Problem: Safety: Goal: Ability to remain free from injury will improve Outcome: Progressing   

## 2019-04-26 ENCOUNTER — Encounter (HOSPITAL_COMMUNITY): Payer: Self-pay | Admitting: Orthopaedic Surgery

## 2019-04-30 ENCOUNTER — Telehealth: Payer: Self-pay | Admitting: Orthopaedic Surgery

## 2019-04-30 MED ORDER — TRAMADOL HCL 50 MG PO TABS: 50.0000 mg | ORAL_TABLET | Freq: Four times a day (QID) | ORAL | 0 refills | Status: DC | PRN

## 2019-04-30 NOTE — Telephone Encounter (Signed)
Patient called needing Rx refilled (Tramadol) Patient said she have 4 tabs left and will run out over the weekend. Patient had surgery 04/23/2019. The number to contact patient is [phone]

## 2019-04-30 NOTE — Telephone Encounter (Signed)
Please advise 

## 2019-04-30 NOTE — Telephone Encounter (Signed)
Please advise. Thanks.  

## 2019-05-03 ENCOUNTER — Telehealth: Payer: Self-pay | Admitting: Orthopaedic Surgery

## 2019-05-03 NOTE — Telephone Encounter (Signed)
Called into pharmacy

## 2019-05-03 NOTE — Telephone Encounter (Signed)
Patient called. Says [facility]. She is in [place]. [id number] [place] [place]

## 2019-05-05 ENCOUNTER — Ambulatory Visit (INDEPENDENT_AMBULATORY_CARE_PROVIDER_SITE_OTHER): Payer: Medicare Other | Admitting: Physician Assistant

## 2019-05-05 ENCOUNTER — Encounter: Payer: Self-pay | Admitting: Physician Assistant

## 2019-05-05 DIAGNOSIS — Z96641 Presence of right artificial hip joint: Secondary | ICD-10-CM

## 2019-05-05 MED ORDER — METHOCARBAMOL 500 MG PO TABS: 500.0000 mg | ORAL_TABLET | Freq: Three times a day (TID) | ORAL | 1 refills | Status: DC

## 2019-05-05 NOTE — Progress Notes (Signed)
HPI: Jane Vasquez returns now 12 days status post right total hip arthroplasty.  She is overall doing well.  She is using a walker to ambulate.  Pain is worse at night.  No significant pain during the day.  Asking for refill on Robaxin.  Had no chest pain shortness of breath fevers chills.  Physical exam: Right hip surgical incisions well approximated with staples no signs of infection.  Slight seroma.  Right calf supple nontender.  Dorsiflexion plantarflexion right ankle intact.  Impression: Status post right total hip arthroplasty  Plan: Staples removed Steri-Strips applied.  Scar tissue mobilization encouraged.  She will go back on to her 81 mg aspirin that she is taking prior to surgery.  Questions encouraged and answered at length.  We will see her back in 1 month sooner if there is any questions or concerns.

## 2019-05-06 ENCOUNTER — Telehealth: Payer: Self-pay | Admitting: Physician Assistant

## 2019-05-06 NOTE — Telephone Encounter (Signed)
Patient called stating that her pharmacy has not received her RX.  Can you please send it in again.  CB#[phone].  Thank you.

## 2019-05-06 NOTE — Telephone Encounter (Signed)
Pt called in said the pharmacy still hasn't received it, she isnt sure what's going on she's wanting for you to give her a call.    [phone]

## 2019-05-06 NOTE — Telephone Encounter (Signed)
Called into pharmacy again

## 2019-05-06 NOTE — Telephone Encounter (Signed)
Pharmacy states she wanted Robaxin not Tramadol This was called in for her

## 2019-05-10 ENCOUNTER — Telehealth: Payer: Self-pay | Admitting: Orthopaedic Surgery

## 2019-05-10 MED ORDER — HYDROCODONE-ACETAMINOPHEN 7.5-325 MG PO TABS: 1.0000 | ORAL_TABLET | Freq: Four times a day (QID) | ORAL | 0 refills | Status: DC | PRN

## 2019-05-10 NOTE — Telephone Encounter (Signed)
I sent in some  

## 2019-05-10 NOTE — Telephone Encounter (Signed)
Please advise 

## 2019-05-10 NOTE — Telephone Encounter (Signed)
Patient called needing Rx refilled( Hydrocodone 7.5). [facility] [address] The number to contact is [phone]

## 2019-05-17 ENCOUNTER — Other Ambulatory Visit: Payer: Self-pay | Admitting: Orthopaedic Surgery

## 2019-05-17 ENCOUNTER — Telehealth: Payer: Self-pay | Admitting: Orthopaedic Surgery

## 2019-05-17 ENCOUNTER — Other Ambulatory Visit: Payer: Self-pay

## 2019-05-17 MED ORDER — TRAMADOL HCL 50 MG PO TABS: 50.0000 mg | ORAL_TABLET | Freq: Four times a day (QID) | ORAL | 0 refills | Status: DC | PRN

## 2019-05-17 MED ORDER — METHOCARBAMOL 500 MG PO TABS: 500.0000 mg | ORAL_TABLET | Freq: Three times a day (TID) | ORAL | 1 refills | Status: DC

## 2019-05-17 NOTE — Telephone Encounter (Signed)
Patient called left voicemail message needing Rx refilled (Tramadol and Methocarbamol) The number to contact patient is [phone]

## 2019-05-17 NOTE — Telephone Encounter (Signed)
Please advise 

## 2019-05-17 NOTE — Telephone Encounter (Signed)
I sent some in 

## 2019-05-17 NOTE — Telephone Encounter (Signed)
Pt called in requesting a refill on Tramadol 50 MG and Methocarbamol. [facility] [address]   [phone]

## 2019-05-19 ENCOUNTER — Telehealth: Payer: Self-pay | Admitting: Orthopaedic Surgery

## 2019-05-19 MED ORDER — HYDROCODONE-ACETAMINOPHEN 7.5-325 MG PO TABS: 1.0000 | ORAL_TABLET | Freq: Four times a day (QID) | ORAL | 0 refills | Status: DC | PRN

## 2019-05-19 NOTE — Telephone Encounter (Signed)
Patient called to request an RX refill on her Hydrocodone.  [facility] [address]  [phone].  Thank you.

## 2019-05-19 NOTE — Telephone Encounter (Signed)
Please advise 

## 2019-05-25 ENCOUNTER — Telehealth: Payer: Self-pay | Admitting: Physician Assistant

## 2019-05-25 MED ORDER — HYDROCODONE-ACETAMINOPHEN 7.5-325 MG PO TABS: 1.0000 | ORAL_TABLET | Freq: Four times a day (QID) | ORAL | 0 refills | Status: DC | PRN

## 2019-05-25 NOTE — Telephone Encounter (Signed)
Pt called in requesting a refill on Tramadol 50 MG and Hydrocodone, please have [facility] [address]   [phone]

## 2019-05-25 NOTE — Telephone Encounter (Signed)
Please advise 

## 2019-05-26 ENCOUNTER — Other Ambulatory Visit: Payer: Self-pay | Admitting: Orthopaedic Surgery

## 2019-05-26 NOTE — Telephone Encounter (Signed)
Please advise 

## 2019-06-01 ENCOUNTER — Other Ambulatory Visit: Payer: Self-pay | Admitting: Orthopaedic Surgery

## 2019-06-01 ENCOUNTER — Telehealth: Payer: Self-pay | Admitting: Orthopaedic Surgery

## 2019-06-01 MED ORDER — METHOCARBAMOL 500 MG PO TABS: 500.0000 mg | ORAL_TABLET | Freq: Three times a day (TID) | ORAL | 1 refills | Status: DC

## 2019-06-01 NOTE — Telephone Encounter (Signed)
She needs to be called about the tramadol.  I just sent in either oxy or norco for her the other day and she requested both one of those and tramadol.  We can not call in both.  She needs to go thru what she has and then we will only provide tramadol for a short time.  She needs to start trying to get off of the narcotics.  I will sen in robaxin only today.

## 2019-06-01 NOTE — Telephone Encounter (Signed)
Patient called needing Rx refilled (Methocarbamol and Tramadol) The number to contact patient is [phone]

## 2019-06-01 NOTE — Telephone Encounter (Signed)
Patient aware of the below  

## 2019-06-01 NOTE — Telephone Encounter (Signed)
Please advise 

## 2019-06-02 ENCOUNTER — Other Ambulatory Visit: Payer: Self-pay

## 2019-06-02 ENCOUNTER — Ambulatory Visit (INDEPENDENT_AMBULATORY_CARE_PROVIDER_SITE_OTHER): Payer: Medicare Other | Admitting: Physician Assistant

## 2019-06-02 ENCOUNTER — Encounter: Payer: Self-pay | Admitting: Physician Assistant

## 2019-06-02 DIAGNOSIS — M1711 Unilateral primary osteoarthritis, right knee: Secondary | ICD-10-CM | POA: Diagnosis not present

## 2019-06-02 DIAGNOSIS — Z96641 Presence of right artificial hip joint: Secondary | ICD-10-CM | POA: Diagnosis not present

## 2019-06-02 MED ORDER — METHYLPREDNISOLONE ACETATE 40 MG/ML IJ SUSP
40.0000 mg | INTRAMUSCULAR | Status: AC | PRN
  Administered 2019-06-02: 40 mg via INTRA_ARTICULAR

## 2019-06-02 MED ORDER — LIDOCAINE HCL 1 % IJ SOLN
3.0000 mL | INTRAMUSCULAR | Status: AC | PRN
  Administered 2019-06-02: 3 mL

## 2019-06-02 NOTE — Progress Notes (Signed)
Office Visit Note   Patient: Jane Vasquez           Date of Birth: 1945/10/16           MRN: [id number] Visit Date: 06/02/2019              Requested by: No referring provider defined for this encounter. PCP: System, Pcp Not In   Assessment & Plan: Visit Diagnoses:  1. Status post total replacement of right hip   2. Primary osteoarthritis of right knee     Plan:  We will see how she does with the right knee injection.  Like to see her back at the end of April or May for reevaluation of the right hip.  She is moving back to her home in [place] [facility].  She will return sooner if there is any questions or concerns regarding the hip or the knee.  Questions were encouraged and answered both of her daughter who was present and her daughter was involved in the conversation today on the phone.  At return visit would like an AP pelvis and a lateral view of the right hip. Follow-Up Instructions: No follow-ups on file.   Orders:  Orders Placed This Encounter  Procedures  . Large Joint Inj   No orders of the defined types were placed in this encounter.     Procedures: Large Joint Inj: R knee on 06/02/2019 5:51 PM Indications: pain Details: 22 G 1.5 in needle, anterolateral approach  Arthrogram: No  Medications: 3 mL lidocaine 1 %; 40 mg methylPREDNISolone acetate 40 MG/ML Outcome: tolerated well, no immediate complications Procedure, treatment alternatives, risks and benefits explained, specific risks discussed. Consent was given by the patient. Immediately prior to procedure a time out was called to verify the correct patient, procedure, equipment, support staff and site/side marked as required. Patient was prepped and draped in the usual sterile fashion.       Clinical Data: No additional findings.   Subjective: Chief Complaint  Patient presents with  . Right Hip - Routine Post Op    HPI Jane Vasquez comes in today 40 days status post right total hip arthroplasty.   She states her right hip overall is doing well.  She does have some burning at times down the right thigh.  Her main complaint is the right knee pain is aching worse when getting up.  She is in a cane to ambulate.  Reviewed prior radiographs of her left knee from 2019 the AP view does show moderate narrowing of the lateral joint line of the right knee.  And on the lateral view glimpse of the patellofemoral joint appears to show moderate patellofemoral arthritis. Review of Systems Negative for fevers chills shortness of breath see HPI otherwise negative  Objective: Vital Signs: There were no vitals taken for this visit.  Physical Exam General well-developed well-nourished female no acute distress Ortho Exam Right hip good range of motion without pain.  Bilateral knees good range of motion.  She has tenderness along lateral joint line of the right knee.  No instability valgus varus stressing right knee.  No abnormal warmth erythema or effusion of the right knee. Specialty Comments:  No specialty comments available.  Imaging: No results found.   PMFS History: Patient Active Problem List   Diagnosis Date Noted  . 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 Unilateral primary osteoarthritis, right hip 04/22/2019  . Unilateral primary osteoarthritis, left hip 06/25/2018   Past Medical  History:  Diagnosis Date  . Gout   . Gout   . HTN (hypertension)   . Osteoarthritis    left knee , right hip     History reviewed. No pertinent family history.  Past Surgical History:  Procedure Laterality Date  . APPENDECTOMY  age 57  . TOTAL HIP ARTHROPLASTY Right 04/23/2019   Procedure: RIGHT TOTAL HIP ARTHROPLASTY ANTERIOR APPROACH;  Surgeon: Mcarthur Rossetti, MD;  Location: [facility];  Service: Orthopedics;  Laterality: Right;   Social History   Occupational History  . Not on file  Tobacco Use  . Smoking status: Never Smoker  . Smokeless tobacco: Never Used  Substance and  Sexual Activity  . Alcohol use: Not Currently  . Drug use: Not on file  . Sexual activity: Not on file

## 2019-06-03 ENCOUNTER — Telehealth: Payer: Self-pay | Admitting: Orthopaedic Surgery

## 2019-06-03 NOTE — Telephone Encounter (Signed)
Here it is

## 2019-06-03 NOTE — Telephone Encounter (Signed)
Patient aware per Artis Delay, we will call this in for her when it is ready to be refilled

## 2019-06-03 NOTE — Telephone Encounter (Signed)
Can you advise since you just saw her please

## 2019-06-03 NOTE — Telephone Encounter (Signed)
Patient called asked for a call back concerning her Rx refilled. Patient said she will be going out of town the weekend. The number to contact patient is [phone]

## 2019-06-08 ENCOUNTER — Telehealth: Payer: Self-pay | Admitting: Orthopaedic Surgery

## 2019-06-08 MED ORDER — TRAMADOL HCL 50 MG PO TABS: 50.0000 mg | ORAL_TABLET | Freq: Four times a day (QID) | ORAL | 0 refills | Status: DC | PRN

## 2019-06-08 NOTE — Telephone Encounter (Signed)
ok 

## 2019-06-08 NOTE — Telephone Encounter (Signed)
Patient called requesting an RX refill on her Tramadol.  [facility] [place]  CB#[phone].  Thank you.

## 2019-06-08 NOTE — Telephone Encounter (Signed)
Please advise 

## 2019-06-16 ENCOUNTER — Other Ambulatory Visit: Payer: Self-pay | Admitting: Orthopaedic Surgery

## 2019-06-16 ENCOUNTER — Telehealth: Payer: Self-pay

## 2019-06-16 MED ORDER — GABAPENTIN 300 MG PO CAPS: 300.0000 mg | ORAL_CAPSULE | Freq: Every day | ORAL | 0 refills | Status: DC

## 2019-06-16 NOTE — Telephone Encounter (Signed)
Patient LM that she had surgery on her hip back in October and is now having a burning sensation in her hip Please call her to discuss  [phone]

## 2019-06-16 NOTE — Telephone Encounter (Signed)
See below

## 2019-06-16 NOTE — Telephone Encounter (Signed)
She would like to try gabapentin [facility]

## 2019-06-16 NOTE — Telephone Encounter (Signed)
Tell her that can be normal postoperative to have some burning in the hip.  We can always put her on a small dose of Neurontin 100 mg to take at night once nightly if she needs it.

## 2019-06-17 ENCOUNTER — Telehealth: Payer: Self-pay | Admitting: Orthopaedic Surgery

## 2019-06-17 MED ORDER — METHOCARBAMOL 500 MG PO TABS: 500.0000 mg | ORAL_TABLET | Freq: Three times a day (TID) | ORAL | 1 refills | Status: DC | PRN

## 2019-06-17 NOTE — Telephone Encounter (Signed)
Patient called requesting an RX refill on her Methocarbamol.  CB#[phone].  Thank you.

## 2019-06-17 NOTE — Telephone Encounter (Signed)
Please advise 

## 2019-06-21 ENCOUNTER — Other Ambulatory Visit: Payer: Self-pay

## 2019-06-21 ENCOUNTER — Telehealth: Payer: Self-pay | Admitting: Orthopaedic Surgery

## 2019-06-21 ENCOUNTER — Other Ambulatory Visit: Payer: Self-pay | Admitting: Orthopaedic Surgery

## 2019-06-21 MED ORDER — METHOCARBAMOL 500 MG PO TABS: 500.0000 mg | ORAL_TABLET | Freq: Three times a day (TID) | ORAL | 1 refills | Status: DC | PRN

## 2019-06-21 NOTE — Telephone Encounter (Signed)
Pt called in regarding her prescription for Methocarbamol said she tried to reach the pharmacy since last Friday and she has been told since that they didn't have a prescription for that medication.  [facility] [place]   [phone]

## 2019-06-21 NOTE — Telephone Encounter (Signed)
Re-faxed.

## 2019-06-23 ENCOUNTER — Telehealth: Payer: Self-pay | Admitting: Orthopaedic Surgery

## 2019-06-23 MED ORDER — TRAMADOL HCL 50 MG PO TABS: 50.0000 mg | ORAL_TABLET | Freq: Four times a day (QID) | ORAL | 0 refills | Status: DC | PRN

## 2019-06-23 NOTE — Telephone Encounter (Signed)
Patient called needing Rx refilled (Tramadol 50 mg) Patient [facility] the [facility] at [address]. The number to contact patient is [phone]

## 2019-06-23 NOTE — Telephone Encounter (Signed)
Please advise 

## 2019-06-23 NOTE — Telephone Encounter (Signed)
Corrected

## 2019-06-23 NOTE — Telephone Encounter (Signed)
Patient called advised Rx was sent to the wrong pharmacy. Patient the Rx should have been sent to the [facility] on [address]    Please see previous note. The  Number to contact patient is [phone]

## 2019-06-28 ENCOUNTER — Telehealth: Payer: Self-pay | Admitting: Orthopaedic Surgery

## 2019-06-28 ENCOUNTER — Other Ambulatory Visit: Payer: Self-pay | Admitting: Orthopaedic Surgery

## 2019-06-28 MED ORDER — TRAMADOL HCL 50 MG PO TABS: 50.0000 mg | ORAL_TABLET | Freq: Four times a day (QID) | ORAL | 0 refills | Status: DC | PRN

## 2019-06-28 NOTE — Telephone Encounter (Signed)
Cancelled Rx at [facility] and called into her [facility] in [place] Patient aware

## 2019-06-28 NOTE — Telephone Encounter (Signed)
Patient called. She would like her medication called in to the [facility] in [place]. Her call back number is [phone]

## 2019-07-14 ENCOUNTER — Other Ambulatory Visit: Payer: Self-pay | Admitting: Orthopaedic Surgery

## 2019-07-14 ENCOUNTER — Telehealth: Payer: Self-pay | Admitting: Orthopaedic Surgery

## 2019-07-14 MED ORDER — TRAMADOL HCL 50 MG PO TABS: 50.0000 mg | ORAL_TABLET | Freq: Four times a day (QID) | ORAL | 0 refills | Status: DC | PRN

## 2019-07-14 NOTE — Telephone Encounter (Signed)
Patient called.  She needs a refill on her tramadol.  Call back number: [phone]

## 2019-07-14 NOTE — Telephone Encounter (Signed)
Please advise 

## 2019-07-23 ENCOUNTER — Telehealth: Payer: Self-pay | Admitting: Orthopaedic Surgery

## 2019-07-23 ENCOUNTER — Other Ambulatory Visit: Payer: Self-pay | Admitting: Orthopaedic Surgery

## 2019-07-23 MED ORDER — HYDROCODONE-ACETAMINOPHEN 5-325 MG PO TABS: 1.0000 | ORAL_TABLET | Freq: Four times a day (QID) | ORAL | 0 refills | Status: DC | PRN

## 2019-07-23 NOTE — Telephone Encounter (Signed)
I did send in some more hydrocodone for just this last time.  Let her know that now it has been 3 months since surgery.  I cannot provide any more narcotics after this last time.  Her continued pain is due to the fact that she is staying on narcotics and has been on narcotics for a long period of time.  We cannot keep her on these medications after this next dose at all.  She can certainly check with her primary care physician or even have [place] refer her to pain management but I am not going to keep her on narcotics

## 2019-07-23 NOTE — Telephone Encounter (Signed)
LMOM for patient of the below message  

## 2019-07-23 NOTE — Telephone Encounter (Signed)
Patient called requesting a refill of hydrocodone. The tramadol is not touching the pain and need something a little stronger. [facility] [place]. Patient phone number is [phone].

## 2019-07-23 NOTE — Telephone Encounter (Signed)
Please advise 

## 2019-08-18 ENCOUNTER — Telehealth: Payer: Self-pay | Admitting: Orthopaedic Surgery

## 2019-08-18 MED ORDER — TRAMADOL HCL 50 MG PO TABS: 50.0000 mg | ORAL_TABLET | Freq: Four times a day (QID) | ORAL | 0 refills | Status: DC | PRN

## 2019-08-18 NOTE — Telephone Encounter (Signed)
Patient called requesting a refill of 50 ml tramadol. Please send to pharmacy on file. Patient phone number is [phone].

## 2019-08-18 NOTE — Telephone Encounter (Signed)
Please advise 

## 2019-08-30 ENCOUNTER — Telehealth: Payer: Self-pay | Admitting: Orthopaedic Surgery

## 2019-08-30 MED ORDER — TRAMADOL HCL 50 MG PO TABS: 50.0000 mg | ORAL_TABLET | Freq: Four times a day (QID) | ORAL | 0 refills | Status: DC | PRN

## 2019-08-30 NOTE — Telephone Encounter (Signed)
She didn't pick it up, so they cancelled order Can we re-send?

## 2019-08-30 NOTE — Telephone Encounter (Signed)
The pharmacy in [place] called. Says the RX for the Tramadol got deleted. Asked if another RX could be sent in or called in. Their number is [phone]

## 2019-08-30 NOTE — Telephone Encounter (Signed)
I sent it in again 

## 2019-09-07 ENCOUNTER — Other Ambulatory Visit: Payer: Self-pay | Admitting: Orthopaedic Surgery

## 2019-09-07 ENCOUNTER — Telehealth: Payer: Self-pay | Admitting: Orthopaedic Surgery

## 2019-09-07 MED ORDER — TRAMADOL HCL 50 MG PO TABS: 50.0000 mg | ORAL_TABLET | Freq: Four times a day (QID) | ORAL | 0 refills | Status: DC | PRN

## 2019-09-07 NOTE — Telephone Encounter (Signed)
Please advise 

## 2019-09-07 NOTE — Telephone Encounter (Signed)
Left message on patients voicemail of the message below

## 2019-09-07 NOTE — Telephone Encounter (Signed)
I did send in some more tramadol for her.  This will absolutely be the last when I send in.  It is getting close to 5 months since her hip replacement.  We can see her back in the office to discuss other treatment options including Neurontin for the burning sensations but we need to have her off narcotic because she is going to continue to her the longer she stays on this.

## 2019-09-07 NOTE — Telephone Encounter (Signed)
Patient called.   She is requesting more Tramadol as the burning sensation in her hip in persistent. Patient needs it called into her [facility] pharmacy.   Call back: [phone]

## 2019-10-12 ENCOUNTER — Telehealth: Payer: Self-pay | Admitting: Orthopaedic Surgery

## 2019-10-12 ENCOUNTER — Other Ambulatory Visit: Payer: Self-pay | Admitting: Orthopaedic Surgery

## 2019-10-12 MED ORDER — TRAMADOL HCL 50 MG PO TABS: 50.0000 mg | ORAL_TABLET | Freq: Four times a day (QID) | ORAL | 0 refills | Status: DC | PRN

## 2019-10-12 NOTE — Telephone Encounter (Signed)
Patient called.   She cancelled her upcoming appointment due to transportation issues but is requesting a refill on Tramadol.  Call back: [phone]

## 2019-10-12 NOTE — Telephone Encounter (Signed)
LMOM for patient of the below message  

## 2019-10-12 NOTE — Telephone Encounter (Signed)
I will send in tramadol just this 1 more time.  Please asked her to not call for the any more narcotics or pain medication from our office.  She can get this to her primary care physician.  It is now been 6 months since her surgery and I do not want any patient on any narcotics after that standpoint.  Again, I will not fill this medication anymore after today's

## 2019-10-12 NOTE — Telephone Encounter (Signed)
Please advise 

## 2019-10-21 ENCOUNTER — Ambulatory Visit: Payer: Medicare Other | Admitting: Physician Assistant

## 2019-11-15 ENCOUNTER — Telehealth: Payer: Self-pay | Admitting: Orthopaedic Surgery

## 2019-11-15 NOTE — Telephone Encounter (Signed)
Patient called to request an RX refill on the Tramadol.  Patient does have an appointment on June 2.  She would like enough medication to last until that appointment.  CB#[phone].  Thank you.

## 2019-11-15 NOTE — Telephone Encounter (Signed)
LMOM for patient stating the last refill was supposed to be the last one and that he wanted her to get refills at this point from her PCP

## 2019-12-01 ENCOUNTER — Ambulatory Visit: Payer: Medicare Other | Admitting: Orthopaedic Surgery

## 2019-12-08 ENCOUNTER — Ambulatory Visit (INDEPENDENT_AMBULATORY_CARE_PROVIDER_SITE_OTHER): Payer: Medicare Other

## 2019-12-08 ENCOUNTER — Encounter: Payer: Self-pay | Admitting: Orthopaedic Surgery

## 2019-12-08 ENCOUNTER — Ambulatory Visit (INDEPENDENT_AMBULATORY_CARE_PROVIDER_SITE_OTHER): Payer: Medicare Other | Admitting: Orthopaedic Surgery

## 2019-12-08 DIAGNOSIS — G8929 Other chronic pain: Secondary | ICD-10-CM | POA: Diagnosis not present

## 2019-12-08 DIAGNOSIS — M25562 Pain in left knee: Secondary | ICD-10-CM | POA: Diagnosis not present

## 2019-12-08 DIAGNOSIS — M1711 Unilateral primary osteoarthritis, right knee: Secondary | ICD-10-CM

## 2019-12-08 DIAGNOSIS — Z96641 Presence of right artificial hip joint: Secondary | ICD-10-CM

## 2019-12-08 DIAGNOSIS — M25561 Pain in right knee: Secondary | ICD-10-CM

## 2019-12-08 DIAGNOSIS — M25551 Pain in right hip: Secondary | ICD-10-CM | POA: Diagnosis not present

## 2019-12-08 DIAGNOSIS — M1712 Unilateral primary osteoarthritis, left knee: Secondary | ICD-10-CM

## 2019-12-08 MED ORDER — METHYLPREDNISOLONE ACETATE 40 MG/ML IJ SUSP
40.0000 mg | INTRAMUSCULAR | Status: AC | PRN
  Administered 2019-12-08: 40 mg via INTRA_ARTICULAR

## 2019-12-08 MED ORDER — LIDOCAINE HCL 1 % IJ SOLN
3.0000 mL | INTRAMUSCULAR | Status: AC | PRN
  Administered 2019-12-08: 3 mL

## 2019-12-08 NOTE — Progress Notes (Signed)
Office Visit Note   Patient: Jane Vasquez           Date of Birth: 1946/03/24           MRN: [id number] Visit Date: 12/08/2019              Requested by: No referring provider defined for this encounter. PCP: System, Pcp Not In   Assessment & Plan: Visit Diagnoses:  1. Pain in right hip   2. History of right hip replacement   3. Chronic pain of left knee   4. Chronic pain of right knee   5. Unilateral primary osteoarthritis, left knee   6. Unilateral primary osteoarthritis, right knee     Plan: I was able to aspirate 20 cc of serous fluid from both knees which she tolerated well.  I did place a steroid injection in both knees.  She is a perfect candidate for hyaluronic acid.  Her daughters want her to certainly stay conservative treatment and I agree with this as well.  We will see her back in 4 weeks to hopefully place hyaluronic acid in both knees.  Follow-Up Instructions: Return in about 4 weeks (around 01/05/2020).   Orders:  Orders Placed This Encounter  Procedures  . Large Joint Inj  . Large Joint Inj  . XR HIP UNILAT W OR W/O PELVIS 1V RIGHT   No orders of the defined types were placed in this encounter.     Procedures: Large Joint Inj: R knee on 12/08/2019 2:50 PM Indications: diagnostic evaluation and pain Details: 22 G 1.5 in needle, superolateral approach  Arthrogram: No  Medications: 3 mL lidocaine 1 %; 40 mg methylPREDNISolone acetate 40 MG/ML Outcome: tolerated well, no immediate complications Procedure, treatment alternatives, risks and benefits explained, specific risks discussed. Consent was given by the patient. Immediately prior to procedure a time out was called to verify the correct patient, procedure, equipment, support staff and site/side marked as required. Patient was prepped and draped in the usual sterile fashion.   Large Joint Inj: L knee on 12/08/2019 2:50 PM Indications: diagnostic evaluation and pain Details: 22 G 1.5 in needle, superolateral  approach  Arthrogram: No  Medications: 3 mL lidocaine 1 %; 40 mg methylPREDNISolone acetate 40 MG/ML Outcome: tolerated well, no immediate complications Procedure, treatment alternatives, risks and benefits explained, specific risks discussed. Consent was given by the patient. Immediately prior to procedure a time out was called to verify the correct patient, procedure, equipment, support staff and site/side marked as required. Patient was prepped and draped in the usual sterile fashion.       Clinical Data: No additional findings.   Subjective: Chief Complaint  Patient presents with  . Right Hip - Follow-up  The patient is following up after having a total hip arthroplasty of the right hip in October 2020.  The hip is doing well for her.  What she is dealing with more so his bilateral knee pain.  We did have x-rays of her knees from 2019 that does show tricompartment arthritis of both knees with slight varus malalignment.  She has not had any type of steroid injections in her knees or hyaluronic acid.  Both knees swell and hurt on a daily basis.  She states that the right hip replacement doing well.  Her family is with her today.  She has had no other acute change in her medical status.  She is not a diabetic.  HPI  Review of Systems She currently denies any  headache, chest pain, shortness of breath, fever, chills, nausea, vomiting  Objective: Vital Signs: There were no vitals taken for this visit.  Physical Exam She is alert and orient x3 and in no acute distress Ortho Exam Examination of her right hip shows the move smoothly and fluidly.  Examination of both knees shows slight valgus malalignment with swelling of both knees. Specialty Comments:  No specialty comments available.  Imaging: No results found.   PMFS History: Patient Active Problem List   Diagnosis Date Noted  . 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 Unilateral primary osteoarthritis,  right hip 04/22/2019  . Unilateral primary osteoarthritis, left hip 06/25/2018   Past Medical History:  Diagnosis Date  . Gout   . Gout   . HTN (hypertension)   . Osteoarthritis    left knee , right hip     History reviewed. No pertinent family history.  Past Surgical History:  Procedure Laterality Date  . APPENDECTOMY  age 63  . TOTAL HIP ARTHROPLASTY Right 04/23/2019   Procedure: RIGHT TOTAL HIP ARTHROPLASTY ANTERIOR APPROACH;  Surgeon: Kathryne Hitch, MD;  Location: [facility];  Service: Orthopedics;  Laterality: Right;   Social History   Occupational History  . Not on file  Tobacco Use  . Smoking status: Never Smoker  . Smokeless tobacco: Never Used  Substance and Sexual Activity  . Alcohol use: Not Currently  . Drug use: Not on file  . Sexual activity: Not on file

## 2019-12-10 ENCOUNTER — Telehealth: Payer: Self-pay

## 2019-12-10 NOTE — Telephone Encounter (Signed)
Bilateral gel injections  

## 2019-12-14 ENCOUNTER — Telehealth: Payer: Self-pay | Admitting: Orthopaedic Surgery

## 2019-12-14 NOTE — Telephone Encounter (Signed)
Patient called requesting hydrocodone for headache she can't get rid of since injection. Please call patient about this matter. Patient asked for refill to go to pharmacy on file. Patient phone number is [phone].

## 2019-12-14 NOTE — Telephone Encounter (Signed)
Noted  

## 2019-12-14 NOTE — Telephone Encounter (Signed)
Called left message . Please call her tomorrow and have her see PCP. Norco is not appropriate treatment for a HA.

## 2019-12-15 NOTE — Telephone Encounter (Signed)
Patient aware of the below message  

## 2019-12-17 ENCOUNTER — Telehealth: Payer: Self-pay

## 2019-12-17 NOTE — Telephone Encounter (Signed)
Submitted VOB for Monovisc, bilateral knee. 

## 2019-12-28 ENCOUNTER — Telehealth: Payer: Self-pay

## 2019-12-28 NOTE — Telephone Encounter (Signed)
Approved, Monovisc, bilateral knee. [place] has been met Patient will be responsible for 20% OOP. No Co-pay No PA required

## 2020-01-10 ENCOUNTER — Ambulatory Visit: Payer: Medicare Other | Admitting: Orthopaedic Surgery

## 2020-02-02 ENCOUNTER — Encounter: Payer: Self-pay | Admitting: Orthopaedic Surgery

## 2020-02-02 ENCOUNTER — Ambulatory Visit (INDEPENDENT_AMBULATORY_CARE_PROVIDER_SITE_OTHER): Payer: Medicare Other | Admitting: Orthopaedic Surgery

## 2020-02-02 ENCOUNTER — Other Ambulatory Visit: Payer: Self-pay

## 2020-02-02 DIAGNOSIS — M1712 Unilateral primary osteoarthritis, left knee: Secondary | ICD-10-CM

## 2020-02-02 DIAGNOSIS — M1711 Unilateral primary osteoarthritis, right knee: Secondary | ICD-10-CM

## 2020-02-02 MED ORDER — HYALURONAN 88 MG/4ML IX SOSY
88.0000 mg | PREFILLED_SYRINGE | INTRA_ARTICULAR | Status: AC | PRN
  Administered 2020-02-02: 88 mg via INTRA_ARTICULAR

## 2020-02-02 NOTE — Progress Notes (Signed)
   Procedure Note  Patient: Jane Vasquez             Date of Birth: [birth year]           MRN: [id number]             Visit Date: 02/02/2020  Procedures: Visit Diagnoses:  1. Unilateral primary osteoarthritis, left knee   2. Unilateral primary osteoarthritis, right knee     Large Joint Inj: R knee on 02/02/2020 3:56 PM Indications: diagnostic evaluation and pain Details: 22 G 1.5 in needle, superolateral approach  Arthrogram: No  Medications: 88 mg Hyaluronan 88 MG/4ML Outcome: tolerated well, no immediate complications Procedure, treatment alternatives, risks and benefits explained, specific risks discussed. Consent was given by the patient. Immediately prior to procedure a time out was called to verify the correct patient, procedure, equipment, support staff and site/side marked as required. Patient was prepped and draped in the usual sterile fashion.   Large Joint Inj: L knee on 02/02/2020 3:56 PM Indications: diagnostic evaluation and pain Details: 22 G 1.5 in needle, superolateral approach  Arthrogram: No  Medications: 88 mg Hyaluronan 88 MG/4ML Outcome: tolerated well, no immediate complications Procedure, treatment alternatives, risks and benefits explained, specific risks discussed. Consent was given by the patient. Immediately prior to procedure a time out was called to verify the correct patient, procedure, equipment, support staff and site/side marked as required. Patient was prepped and draped in the usual sterile fashion.    The patient is here today for scheduled hyaluronic acid injections in both knees with Monovisc to treat the pain from osteoarthritis.  She has tried and failed other conservative treatment measures including the failure of steroid injections.  She does have known tricompartment arthritis in both her knees.  She has had no acute change in medical status.  Still both knees swell.  This is detriment affecting her actives daily living, her mobility and her  quality of life.  Both knees have a moderate effusion today.  Both knees applies Monovisc and well without difficulty.  Follow-up will be as needed.  All question concerns were answered and addressed.

## 2020-04-03 ENCOUNTER — Other Ambulatory Visit: Payer: Self-pay | Admitting: Orthopaedic Surgery

## 2020-04-03 ENCOUNTER — Telehealth: Payer: Self-pay

## 2020-04-03 MED ORDER — IBUPROFEN 800 MG PO TABS: 800.0000 mg | ORAL_TABLET | Freq: Two times a day (BID) | ORAL | 1 refills | Status: DC | PRN

## 2020-04-03 NOTE — Telephone Encounter (Signed)
Please advise 

## 2020-04-03 NOTE — Telephone Encounter (Signed)
I sent some in to her pharmacy. 

## 2020-04-03 NOTE — Telephone Encounter (Signed)
Patient called she is requesting prescription to be refilled for ibuprofen for pain until her scheduled appointment. Call [phone]

## 2020-04-26 ENCOUNTER — Ambulatory Visit (INDEPENDENT_AMBULATORY_CARE_PROVIDER_SITE_OTHER): Payer: Medicare Other | Admitting: Orthopaedic Surgery

## 2020-04-26 ENCOUNTER — Other Ambulatory Visit: Payer: Self-pay

## 2020-04-26 DIAGNOSIS — M1712 Unilateral primary osteoarthritis, left knee: Secondary | ICD-10-CM

## 2020-04-26 DIAGNOSIS — M542 Cervicalgia: Secondary | ICD-10-CM

## 2020-04-26 DIAGNOSIS — M1711 Unilateral primary osteoarthritis, right knee: Secondary | ICD-10-CM | POA: Diagnosis not present

## 2020-04-26 MED ORDER — LIDOCAINE HCL 1 % IJ SOLN
3.0000 mL | INTRAMUSCULAR | Status: AC | PRN
  Administered 2020-04-26: 3 mL

## 2020-04-26 MED ORDER — METHYLPREDNISOLONE ACETATE 40 MG/ML IJ SUSP
40.0000 mg | INTRAMUSCULAR | Status: AC | PRN
  Administered 2020-04-26: 40 mg via INTRA_ARTICULAR

## 2020-04-26 NOTE — Addendum Note (Signed)
Addended by: Shonna Chock on: 04/26/2020 03:28 PM   Modules accepted: Orders

## 2020-04-26 NOTE — Progress Notes (Signed)
Office Visit Note   Patient: ABBEE Vasquez           Date of Birth: Aug 06, 1945           MRN: [id number] Visit Date: 04/26/2020              Requested by: No referring provider defined for this encounter. PCP: Pcp, No   Assessment & Plan: Visit Diagnoses:  1. Unilateral primary osteoarthritis, left knee   2. Unilateral primary osteoarthritis, right knee   3. Cervicalgia     Plan: I did place a steroid injection in both knees today.  I talked to her in detail in length about knee replacement surgery.  We will E send her to outpatient physical therapy to work on any modalities that can help with her balance and coordination and strengthen her quad muscles.  They can work on her cervical spine as well.  We will also draw a rheumatoid panel today.  We can always see her back in 3 months after course of therapy and discuss knee replacement surgery at some point.  Follow-Up Instructions: Return in about 3 months (around 07/27/2020).   Orders:  Orders Placed This Encounter  Procedures  . Large Joint Inj  . Large Joint Inj   No orders of the defined types were placed in this encounter.     Procedures: Large Joint Inj: R knee on 04/26/2020 2:47 PM Indications: diagnostic evaluation and pain Details: 22 G 1.5 in needle, superolateral approach  Arthrogram: No  Medications: 3 mL lidocaine 1 %; 40 mg methylPREDNISolone acetate 40 MG/ML Outcome: tolerated well, no immediate complications Procedure, treatment alternatives, risks and benefits explained, specific risks discussed. Consent was given by the patient. Immediately prior to procedure a time out was called to verify the correct patient, procedure, equipment, support staff and site/side marked as required. Patient was prepped and draped in the usual sterile fashion.   Large Joint Inj: L knee on 04/26/2020 2:47 PM Indications: diagnostic evaluation and pain Details: 22 G 1.5 in needle, superolateral approach  Arthrogram:  No  Medications: 3 mL lidocaine 1 %; 40 mg methylPREDNISolone acetate 40 MG/ML Outcome: tolerated well, no immediate complications Procedure, treatment alternatives, risks and benefits explained, specific risks discussed. Consent was given by the patient. Immediately prior to procedure a time out was called to verify the correct patient, procedure, equipment, support staff and site/side marked as required. Patient was prepped and draped in the usual sterile fashion.       Clinical Data: No additional findings.   Subjective: Chief Complaint  Patient presents with  . Left Knee - Pain  . Right Knee - Pain  The patient comes in today with continued bilateral knee pain that is chronic.  She has valgus malalignment of both knees.  She is 74 years old.  Her daughter is with her today as well.  She is also complaining of neck pain and stiffness.  She does have right hip pain as well.  She has a history of a right total hip arthroplasty.  Her daughter says that there is concern about family history of rheumatoid disease.  She says her mom stays in the chair quite a bit and is very stiff with all of her joints.  She has had no other acute change in her medical status.  I last placed hyaluronic acid in both knees in August of this year which did not work for her at all.  HPI  Review of Systems She currently  denies any radicular symptoms of her upper or lower extremities.  She denies any headache, chest pain, shortness of breath, fever, chills, nausea, vomiting  Objective: Vital Signs: There were no vitals taken for this visit.  Physical Exam She is alert and orient x3 and in no acute distress Ortho Exam Examination of both knees show significant valgus malalignment with some swelling.  She has a painful range of motion and significant medial and lateral joint line tenderness as well as patellofemoral crepitation.  Her neck has stiffness throughout his range of motion but no midline tenderness.   Is mainly in the paraspinal muscles.  She has good strength in her bilateral upper extremities. Specialty Comments:  No specialty comments available.  Imaging: No results found.   PMFS History: Patient Active Problem List   Diagnosis Date Noted  . 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 Unilateral primary osteoarthritis, right hip 04/22/2019  . Unilateral primary osteoarthritis, left hip 06/25/2018   Past Medical History:  Diagnosis Date  . Gout   . Gout   . HTN (hypertension)   . Osteoarthritis    left knee , right hip     No family history on file.  Past Surgical History:  Procedure Laterality Date  . APPENDECTOMY  age 17  . TOTAL HIP ARTHROPLASTY Right 04/23/2019   Procedure: RIGHT TOTAL HIP ARTHROPLASTY ANTERIOR APPROACH;  Surgeon: Kathryne Hitch, MD;  Location: [facility];  Service: Orthopedics;  Laterality: Right;   Social History   Occupational History  . Not on file  Tobacco Use  . Smoking status: Never Smoker  . Smokeless tobacco: Never Used  Substance and Sexual Activity  . Alcohol use: Not Currently  . Drug use: Not on file  . Sexual activity: Not on file

## 2020-04-27 IMAGING — RF DG HIP (WITH PELVIS) OPERATIVE*R*
1 series · 2 of 2 positions shown · non-contrast
Comparison: 06/25/2018

CLINICAL DATA: Intraoperative right hip replacement

EXAM:
OPERATIVE RIGHT HIP (WITH PELVIS IF PERFORMED) to VIEWS
TECHNIQUE: Fluoroscopic spot image(s) were submitted for interpretation
post-operatively.

[Series 1: unknown protocol · 0.20mm/px · 2 of 2 slices shown]
[im 1/2]
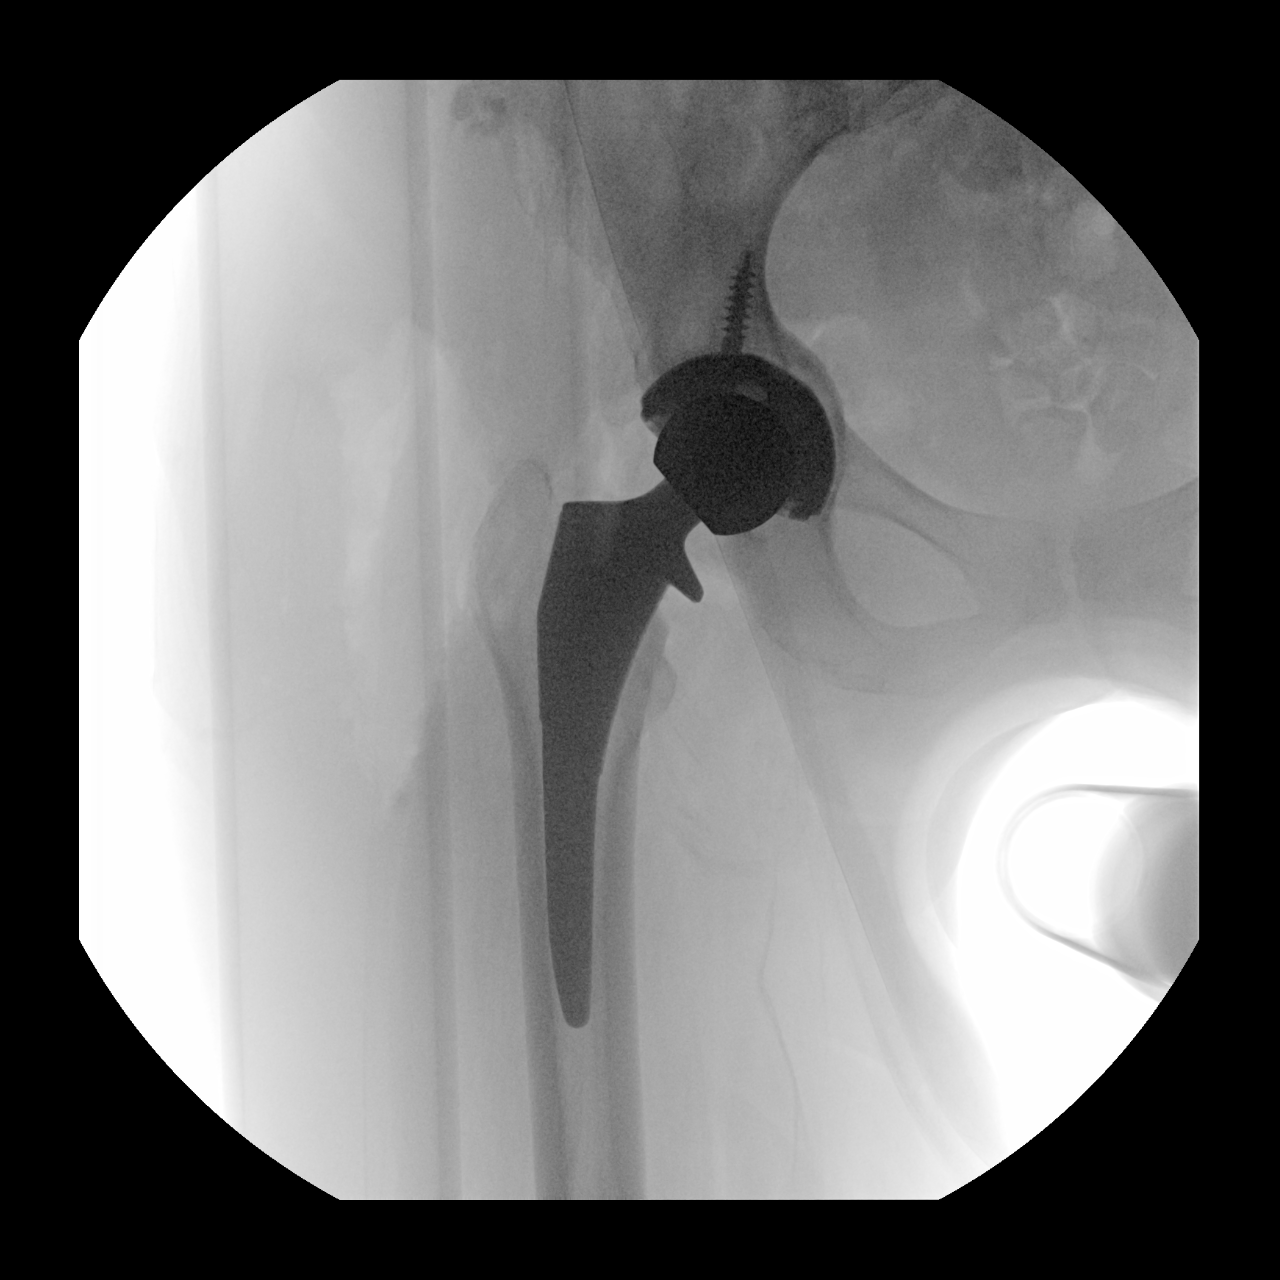
[im 2/2]
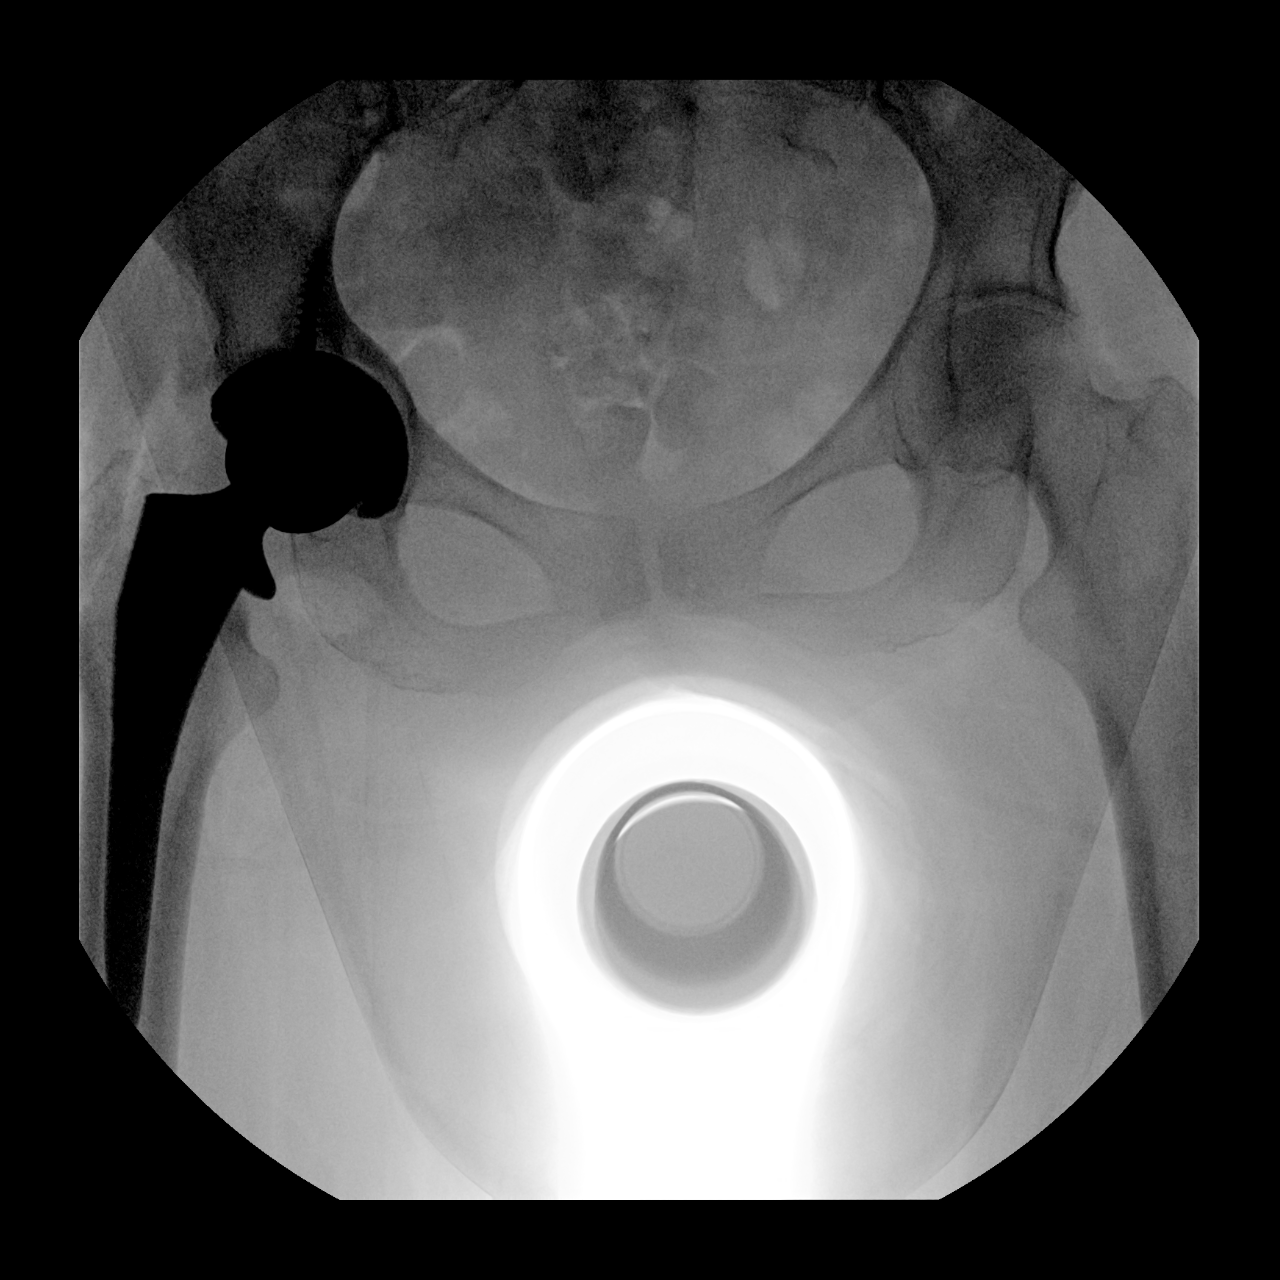

[2 of 2 positions shown; findings below may reference images not displayed]

FINDINGS: Changes of right hip replacement. Normal AP alignment. No hardware
bony complicating feature.
IMPRESSION: Right hip replacement.  No visible complicating feature.

## 2020-04-28 LAB — SEDIMENTATION RATE: Sed Rate: 19 mm/h (ref 0–30)

## 2020-04-28 LAB — ANTI-NUCLEAR AB-TITER (ANA TITER): ANA Titer 1: 1:1280 {titer} — ABNORMAL HIGH

## 2020-04-28 LAB — RHEUMATOID FACTOR: Rheumatoid fact SerPl-aCnc: <14 IU/mL (ref <14)

## 2020-04-28 LAB — URIC ACID: Uric Acid, Serum: 6.9 mg/dL (ref 2.5–7.0)

## 2020-04-28 LAB — ANA: Anti Nuclear Antibody (ANA): POSITIVE — AB

## 2020-06-07 DIAGNOSIS — M170 Bilateral primary osteoarthritis of knee: Secondary | ICD-10-CM | POA: Insufficient documentation

## 2020-07-27 ENCOUNTER — Ambulatory Visit: Payer: Medicare Other | Admitting: Orthopaedic Surgery

## 2020-07-31 ENCOUNTER — Telehealth: Payer: Self-pay | Admitting: Orthopaedic Surgery

## 2020-07-31 NOTE — Telephone Encounter (Signed)
Records refaxed to [facility] [phone], initially faxed 07/18/20

## 2020-08-10 ENCOUNTER — Ambulatory Visit (INDEPENDENT_AMBULATORY_CARE_PROVIDER_SITE_OTHER): Payer: Medicare (Managed Care) | Admitting: Physician Assistant

## 2020-08-10 ENCOUNTER — Encounter: Payer: Self-pay | Admitting: Physician Assistant

## 2020-08-10 ENCOUNTER — Other Ambulatory Visit: Payer: Self-pay

## 2020-08-10 DIAGNOSIS — M1711 Unilateral primary osteoarthritis, right knee: Secondary | ICD-10-CM

## 2020-08-10 DIAGNOSIS — M170 Bilateral primary osteoarthritis of knee: Secondary | ICD-10-CM

## 2020-08-10 DIAGNOSIS — M1712 Unilateral primary osteoarthritis, left knee: Secondary | ICD-10-CM

## 2020-08-10 MED ORDER — LIDOCAINE HCL 1 % IJ SOLN
0.5000 mL | INTRAMUSCULAR | Status: AC | PRN
  Administered 2020-08-10: .5 mL

## 2020-08-10 MED ORDER — METHYLPREDNISOLONE ACETATE 40 MG/ML IJ SUSP
40.0000 mg | INTRAMUSCULAR | Status: AC | PRN
  Administered 2020-08-10: 40 mg via INTRA_ARTICULAR

## 2020-08-10 NOTE — Progress Notes (Signed)
Office Visit Note   Patient: PHYLLISS STREGE           Date of Birth: 03-03-1946           MRN: [id number] Visit Date: 08/10/2020              Requested by: No referring provider defined for this encounter. PCP: Pcp, No   Assessment & Plan: Visit Diagnoses:  1. Primary osteoarthritis of right knee   2. Unilateral primary osteoarthritis, left knee     Plan:  Discussed with Ms. Noy and her husband is present throughout the examination today we will refer her to rheumatology due to her rheumatologic panel findings.  In regards to her knee pain and arthritic changes she can have repeat cortisone injections no more often than every 3 months.  Questions were encouraged and answered at length.  Patient tolerated injection aspiration well today.  Follow-Up Instructions: Return if symptoms worsen or fail to improve.   Orders:  Orders Placed This Encounter  Procedures  . Large Joint Inj: bilateral knee   No orders of the defined types were placed in this encounter.     Procedures: Large Joint Inj: bilateral knee on 08/10/2020 1:57 PM Indications: pain Details: 22 G 1.5 in needle, anterolateral approach  Arthrogram: No  Medications (Right): 0.5 mL lidocaine 1 %; 40 mg methylPREDNISolone acetate 40 MG/ML Aspirate (Right): 15 mL yellow Medications (Left): 0.5 mL lidocaine 1 %; 40 mg methylPREDNISolone acetate 40 MG/ML Aspirate (Left): 14 mL yellow Outcome: tolerated well, no immediate complications Procedure, treatment alternatives, risks and benefits explained, specific risks discussed. Consent was given by the patient. Immediately prior to procedure a time out was called to verify the correct patient, procedure, equipment, support staff and site/side marked as required. Patient was prepped and draped in the usual sterile fashion.       Clinical Data: No additional findings.   Subjective: Chief Complaint  Patient presents with  . Right Knee - Pain  . Left Knee - Pain     HPI Mrs. Ruzich returns today for bilateral knee pain.  She presents today with her husband.  She is having difficulty ambulating at times especially when first getting up from a seated position due to bilateral knee pain.  She has had no change in her overall medical status.  She did undergo blood work for rheumatoid panel.  States that the last cortisone injections on 04/26/2020 gave her good relief for almost 3 months. Review of Systems Negative for fevers chills or recent vaccines.  Objective: Vital Signs: There were no vitals taken for this visit.  Physical Exam Constitutional:      Appearance: She is not ill-appearing or diaphoretic.  Neurological:     Mental Status: She is alert and oriented to person, place, and time.  Psychiatric:        Mood and Affect: Mood normal.     Ortho Exam Bilateral knees no abnormal warmth erythema.  Positive edema and effusion bilateral knees.  Pain with any attempts at range of motion both knees.  Tenderness along medial lateral joint line of both knees.  Significant patellofemoral crepitus bilaterally.  Valgus malalignment bilateral knees.  Specialty Comments:  ANA titer 1: Elevated at 1: 1280 ANA pattern 1: Nuclear speckled ANA nuclear antibody: Positive Sed rate: 19 mm/h within normal limits Rheumatoid factor less than 14 IU/mL within normal limits Uric acid: 6.9mg /dL reference range 2.5 to 7.0 mg/dL  Imaging: No results found.   PMFS History:  Patient Active Problem List   Diagnosis Date Noted  . Degenerative arthritis of knee, bilateral 06/07/2020  . 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 Unilateral primary osteoarthritis, right hip 04/22/2019  . Unilateral primary osteoarthritis, left hip 06/25/2018   Past Medical History:  Diagnosis Date  . Gout   . Gout   . HTN (hypertension)   . Osteoarthritis    left knee , right hip     History reviewed. No pertinent family history.  Past Surgical History:   Procedure Laterality Date  . APPENDECTOMY  age 43  . TOTAL HIP ARTHROPLASTY Right 04/23/2019   Procedure: RIGHT TOTAL HIP ARTHROPLASTY ANTERIOR APPROACH;  Surgeon: Kathryne Hitch, MD;  Location: [facility];  Service: Orthopedics;  Laterality: Right;   Social History   Occupational History  . Not on file  Tobacco Use  . Smoking status: Never Smoker  . Smokeless tobacco: Never Used  Substance and Sexual Activity  . Alcohol use: Not Currently  . Drug use: Not on file  . Sexual activity: Not on file

## 2020-08-11 NOTE — Addendum Note (Signed)
Addended by: Barbette Or on: 08/11/2020 08:17 AM   Modules accepted: Orders

## 2020-08-28 ENCOUNTER — Telehealth: Payer: Self-pay | Admitting: Physician Assistant

## 2020-08-28 NOTE — Telephone Encounter (Signed)
Advised pt we are trying to find a rheumatologist who will take her insurance

## 2020-08-28 NOTE — Telephone Encounter (Signed)
Patient called stating she was suppose to be referred to a rheumatologist. Patient said she was calling to check status of the referral. The number to contact patient is [phone]

## 2020-09-13 ENCOUNTER — Ambulatory Visit: Payer: Medicare (Managed Care) | Admitting: Physician Assistant

## 2020-09-20 ENCOUNTER — Ambulatory Visit: Payer: Self-pay

## 2020-09-20 ENCOUNTER — Encounter: Payer: Self-pay | Admitting: Orthopaedic Surgery

## 2020-09-20 ENCOUNTER — Ambulatory Visit (INDEPENDENT_AMBULATORY_CARE_PROVIDER_SITE_OTHER): Payer: Medicare (Managed Care)

## 2020-09-20 ENCOUNTER — Ambulatory Visit: Payer: Medicare (Managed Care) | Admitting: Orthopaedic Surgery

## 2020-09-20 VITALS — Ht 63.0 in | Wt 155.6 lb

## 2020-09-20 DIAGNOSIS — M1712 Unilateral primary osteoarthritis, left knee: Secondary | ICD-10-CM | POA: Diagnosis not present

## 2020-09-20 DIAGNOSIS — M1711 Unilateral primary osteoarthritis, right knee: Secondary | ICD-10-CM | POA: Diagnosis not present

## 2020-09-20 DIAGNOSIS — M170 Bilateral primary osteoarthritis of knee: Secondary | ICD-10-CM | POA: Diagnosis not present

## 2020-09-20 MED ORDER — ACETAMINOPHEN-CODEINE #3 300-30 MG PO TABS
1.0000 | ORAL_TABLET | Freq: Three times a day (TID) | ORAL | 0 refills | Status: DC | PRN
Start: 2020-09-20 — End: 2020-11-08

## 2020-09-20 NOTE — Progress Notes (Signed)
Office Visit Note   Patient: Jane Vasquez           Date of Birth: [birth year]           MRN: [id number] Visit Date: 09/20/2020              Requested by: No referring provider defined for this encounter. PCP: Pcp, No   Assessment & Plan: Visit Diagnoses:  1. Unilateral primary osteoarthritis, left knee   2. Primary osteoarthritis of right knee     Plan: Having exhausted all conservative treatment measures, we are recommending a total knee arthroplasty for both knees.  We received the right knee first because that is her for the worst.  I showed her knee model and explained in detail what this type of surgery involves.  We talked about the interoperative and postoperative course as well as a thorough description of the risk and benefits of surgery.  We will work on getting this scheduled in the near future.  We will try some Tylenol 3 for pain to use intermittently and sparingly.  All questions and concerns were answered and addressed.  Follow-Up Instructions: Return for 2 weeks post-op.   Orders:  Orders Placed This Encounter  Procedures  . XR Knee 1-2 Views Right  . XR Knee 1-2 Views Left   Meds ordered this encounter  Medications  . acetaminophen-codeine (TYLENOL #3) 300-30 MG tablet    Sig: Take 1-2 tablets by mouth every 8 (eight) hours as needed.    Dispense:  30 tablet    Refill:  0      Procedures: No procedures performed   Clinical Data: No additional findings.   Subjective: Chief Complaint  Patient presents with  . Right Knee - Pain  . Left Knee - Pain  The patient is well-known to me.  We followed her for many years now with bilateral knee pain with the right worse than left.  Both knees swell quite a bit and has had significant synovitis.  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  We replaced the hip on her before.  We have aspirated the knee on multiple occasions in place steroid injections in both knees as well as hyaluronic acid in both knees.  She has  failed conservative treatment for now well over 12 months.  Her pain is daily in both knees and they are detrimentally affecting her mobility, her quality of life and her activities day living.  Her daughter is with her today as well.  We have at this point exhausted all conservative forms of treatment.  She is also taken anti-inflammatories and worked on quad strengthening exercises walks with an assistive device.  We last provided a steroid injections in both knees and aspirated the knees just last month.  HPI  Review of Systems She currently denies a headache, chest pain, shortness of breath, fever, chills, nausea, vomiting  Objective: Vital Signs: Ht 5\' 3"  (1.6 m)   Wt 155 lb 9.6 oz (70.6 kg)   BMI 27.56 kg/m   Physical Exam She is alert and oriented x3 and in no acute distress but very slow to mobilize. Ortho Exam Examination of both knees shows moderate effusion of both knees.  Both knees have significant valgus malalignment and severe patellofemoral crepitation with pain throughout the arc of motion and significant global tenderness along both joint lines. Specialty Comments:  No specialty comments available.  Imaging: XR Knee 1-2 Views Left  Result Date: 09/20/2020 2 views of the left  knee show severe end-stage arthritis with valgus malalignment.  There is complete loss of the joint space of the patellofemoral joint and the lateral joint line.  There is significant medial joint space narrowing as well.  There are osteophytes in all 3 compartments.  XR Knee 1-2 Views Right  Result Date: 09/20/2020 2 views of the right knee show severe tricompartment arthritis with valgus malalignment.  There is complete loss of lateral joint space in the patellofemoral joint space.  There is actually cystic changes in the patella and the proximal tibia and particular osteophytes.    PMFS History: Patient Active Problem List   Diagnosis Date Noted  . Degenerative arthritis of knee, bilateral  06/07/2020  . 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 Unilateral primary osteoarthritis, right hip 04/22/2019  . Unilateral primary osteoarthritis, left hip 06/25/2018   Past Medical History:  Diagnosis Date  . Gout   . Gout   . HTN (hypertension)   . Osteoarthritis    left knee , right hip     History reviewed. No pertinent family history.  Past Surgical History:  Procedure Laterality Date  . APPENDECTOMY  age 21  . TOTAL HIP ARTHROPLASTY Right 04/23/2019   Procedure: RIGHT TOTAL HIP ARTHROPLASTY ANTERIOR APPROACH;  Surgeon: Kathryne Hitch, MD;  Location: [facility];  Service: Orthopedics;  Laterality: Right;   Social History   Occupational History  . Not on file  Tobacco Use  . Smoking status: Never Smoker  . Smokeless tobacco: Never Used  Substance and Sexual Activity  . Alcohol use: Not Currently  . Drug use: Not on file  . Sexual activity: Not on file

## 2020-11-08 ENCOUNTER — Telehealth: Payer: Self-pay | Admitting: Orthopaedic Surgery

## 2020-11-08 MED ORDER — ACETAMINOPHEN-CODEINE #3 300-30 MG PO TABS: 1.0000 | ORAL_TABLET | Freq: Three times a day (TID) | ORAL | 0 refills | Status: DC | PRN

## 2020-11-08 NOTE — Telephone Encounter (Signed)
Surgery planned 12/12/20.  Thanks!

## 2020-11-08 NOTE — Telephone Encounter (Signed)
I sent in some Tylenol with codeine to her pharmacy.  I forward this message also this year to see if she can find out what happened with surgery scheduling for a knee replacement for her.  At our last visit in March we recommended knee replacement and I felt this was no be scheduled but I may be wrong.

## 2020-11-08 NOTE — Telephone Encounter (Signed)
Patient called. She would like some hydrocodone called in for pain. Her call back number is [phone] [id number] - [place], [place] - [address]

## 2020-11-09 ENCOUNTER — Telehealth: Payer: Self-pay | Admitting: Orthopaedic Surgery

## 2020-11-09 ENCOUNTER — Other Ambulatory Visit: Payer: Self-pay | Admitting: Orthopaedic Surgery

## 2020-11-09 MED ORDER — TRAMADOL HCL 50 MG PO TABS: 50.0000 mg | ORAL_TABLET | Freq: Four times a day (QID) | ORAL | 0 refills | Status: DC | PRN

## 2020-11-09 NOTE — Telephone Encounter (Signed)
Please advise 

## 2020-11-09 NOTE — Telephone Encounter (Signed)
LMOM for patient that her Rx was called in

## 2020-11-09 NOTE — Telephone Encounter (Signed)
Pt called and states that she can not take the prescription that you sent in (tylenol 3). She said she is allergic to codeine. She would like something else called in for her pain.  CB [phone]

## 2020-12-07 ENCOUNTER — Other Ambulatory Visit: Payer: Self-pay | Admitting: Physician Assistant

## 2020-12-07 NOTE — Progress Notes (Addendum)
Surgical Instructions    Your procedure is scheduled on Tuesday June 14th.  Report to [facility] Main Entrance "A" at 12:30 P.M., then check in with the Admitting office.  Call this number if you have problems the morning of surgery:  [phone]   If you have any questions prior to your surgery date call [phone]: Open Monday-Friday 8am-4pm    Remember:  Do not eat after midnight the night before your surgery  You may drink clear liquids until 1130am the morning of your surgery.   Clear liquids allowed are: Water, Non-Citrus Juices (without pulp), Carbonated Beverages, Clear Tea, Black Coffee Only, and Gatorade   Enhanced Recovery after Surgery for Orthopedics Enhanced Recovery after Surgery is a protocol used to improve the stress on your body and your recovery after surgery.  Patient Instructions  The day of surgery (if you do NOT have diabetes):  Drink ONE (1) Pre-Surgery Clear Ensure by __11:30___ am the morning of surgery   This drink was given to you during your hospital  pre-op appointment visit. Nothing else to drink after completing the  Pre-Surgery Clear Ensure.         If you have questions, please contact your surgeon's office.     Take these medicines the morning of surgery with A SIP OF WATER  allopurinol (ZYLOPRIM) 100 MG tablet amLODipine (NORVASC) 10 MG tablet chlorthalidone (HYGROTON) 25 MG tablet HYDROcodone-acetaminophen (NORCO/VICODIN) 5-325 MG tablet if needed  As of today, STOP taking any Aspirin (unless otherwise instructed by your surgeon) Aleve, Naproxen, Ibuprofen, Motrin, Advil, Goody's, BC's, all herbal medications, fish oil, and all vitamins.          Do not wear jewelry  Do not wear lotions, powders, colognes, or deodorant. Do not shave 48 hours prior to surgery.  Men may shave face and neck. Do not bring valuables to the hospital.             [facility] is not responsible for any belongings or valuables.  Do NOT Smoke  (Tobacco/Vaping) or drink Alcohol 24 hours prior to your procedure If you use a CPAP at night, you may bring all equipment for your overnight stay.   Contacts, glasses, dentures or bridgework may not be worn into surgery, please bring cases for these belongings   For patients admitted to the hospital, discharge time will be determined by your treatment team.   Patients discharged the day of surgery will not be allowed to drive home, and someone needs to stay with them for 24 hours.  ONLY 1 SUPPORT PERSON MAY BE PRESENT WHILE YOU ARE IN SURGERY. IF YOU ARE TO BE ADMITTED ONCE YOU ARE IN YOUR ROOM YOU WILL BE ALLOWED TWO (2) VISITORS.  Minor children may have two parents present. Special consideration for safety and communication needs will be reviewed on a case by case basis.  Special instructions:    Oral Hygiene is also important to reduce your risk of infection.  Remember - BRUSH YOUR TEETH THE MORNING OF SURGERY WITH YOUR REGULAR TOOTHPASTE   [facility]- Preparing For Surgery  Before surgery, you can play an important role. Because skin is not sterile, your skin needs to be as free of germs as possible. You can reduce the number of germs on your skin by washing with CHG (chlorahexidine gluconate) Soap before surgery.  CHG is an antiseptic cleaner which kills germs and bonds with the skin to continue killing germs even after washing.     Please do  not use if you have an allergy to CHG or antibacterial soaps. If your skin becomes reddened/irritated stop using the CHG.  Do not shave (including legs and underarms) for at least 48 hours prior to first CHG shower. It is OK to shave your face.  Please follow these instructions carefully.     Shower the NIGHT BEFORE SURGERY and the MORNING OF SURGERY with CHG Soap.   If you chose to wash your hair, wash your hair first as usual with your normal shampoo. After you shampoo, rinse your hair and body thoroughly to remove the shampoo.  Then [employer] and genitals (private parts) with your normal soap and rinse thoroughly to remove soap.  After that Use CHG Soap as you would any other liquid soap. You can apply CHG directly to the skin and wash gently with a scrungie or a clean washcloth.   Apply the CHG Soap to your body ONLY FROM THE NECK DOWN.  Do not use on open wounds or open sores. Avoid contact with your eyes, ears, mouth and genitals (private parts). Wash Face and genitals (private parts)  with your normal soap.   Wash thoroughly, paying special attention to the area where your surgery will be performed.  Thoroughly rinse your body with warm water from the neck down.  DO NOT shower/wash with your normal soap after using and rinsing off the CHG Soap.  Pat yourself dry with a CLEAN TOWEL.  Wear CLEAN PAJAMAS to bed the night before surgery  Place CLEAN SHEETS on your bed the night before your surgery  DO NOT SLEEP WITH PETS.   Day of Surgery:  Take a shower with CHG soap. Wear Clean/Comfortable clothing the morning of surgery Do not apply any deodorants/lotions.   Remember to brush your teeth WITH YOUR REGULAR TOOTHPASTE.   Please read over the following fact sheets that you were given.

## 2020-12-08 ENCOUNTER — Other Ambulatory Visit: Payer: Self-pay

## 2020-12-08 ENCOUNTER — Encounter (HOSPITAL_COMMUNITY)
Admission: RE | Admit: 2020-12-08 | Discharge: 2020-12-08 | Disposition: A | Discharge status: Home / Self Care | Payer: Medicare Other | Source: Ambulatory Visit | Attending: Orthopaedic Surgery | Admitting: Orthopaedic Surgery

## 2020-12-08 ENCOUNTER — Telehealth: Payer: Self-pay | Admitting: Orthopaedic Surgery

## 2020-12-08 ENCOUNTER — Encounter (HOSPITAL_COMMUNITY): Payer: Self-pay

## 2020-12-08 DIAGNOSIS — Z01818 Encounter for other preprocedural examination: Secondary | ICD-10-CM | POA: Diagnosis present

## 2020-12-08 DIAGNOSIS — Z20822 Contact with and (suspected) exposure to covid-19: Secondary | ICD-10-CM | POA: Diagnosis not present

## 2020-12-08 LAB — CBC
HCT: 37.3 % (ref 36.0–46.0)
Hemoglobin: 12.7 g/dL (ref 12.0–15.0)
MCH: 27 pg (ref 26.0–34.0)
MCHC: 34 g/dL (ref 30.0–36.0)
MCV: 79.4 fL — ABNORMAL LOW (ref 80.0–100.0)
Platelets: 254 10*3/uL (ref 150–400)
RBC: 4.7 MIL/uL (ref 3.87–5.11)
RDW: 14 % (ref 11.5–15.5)
WBC: 9 10*3/uL (ref 4.0–10.5)
nRBC: 0 % (ref 0.0–0.2)

## 2020-12-08 LAB — SURGICAL PCR SCREEN
MRSA, PCR: NEGATIVE
Staphylococcus aureus: NEGATIVE

## 2020-12-08 LAB — TYPE AND SCREEN
ABO/RH(D): O POS
Antibody Screen: NEGATIVE

## 2020-12-08 LAB — BASIC METABOLIC PANEL
Anion gap: 12 (ref 5–15)
BUN: 15 mg/dL (ref 8–23)
CO2: 25 mmol/L (ref 22–32)
Calcium: 10 mg/dL (ref 8.9–10.3)
Chloride: 100 mmol/L (ref 98–111)
Creatinine, Ser: 0.81 mg/dL (ref 0.44–1.00)
GFR, Estimated: >60 mL/min (ref >60)
Glucose, Bld: 85 mg/dL (ref 70–99)
Potassium: 4.2 mmol/L (ref 3.5–5.1)
Sodium: 137 mmol/L (ref 135–145)

## 2020-12-08 LAB — SARS CORONAVIRUS 2 (TAT 6-24 HRS): SARS Coronavirus 2: NEGATIVE

## 2020-12-08 NOTE — Telephone Encounter (Signed)
Pt called with daughter and they wanted to know when they need to stop medication prior to her surgery on 12/12/20. The best call back number is the daughter Bridgett's number [phone].

## 2020-12-08 NOTE — Progress Notes (Signed)
PCP - Was Dr. Sallyanne Havers who is no longer practicing at [facility] in [place], [place]. Cardiologist - Denies  Chest x-ray - Not indicated EKG - 12/08/20 Stress Test - Denies ECHO - Denies Cardiac Cath - Denies  Sleep Study - Denies  DM - Denies  Aspirin Instructions: Will call Dr. Eliberto Ivory office to find out when to stop the Aspirin.   ERAS Protcol -Yes PRE-SURGERY Ensure given   COVID TEST- 12/08/20  Anesthesia review: No  Patient denies shortness of breath, fever, cough and chest pain at PAT appointment   All instructions explained to the patient, with a verbal understanding of the material. Patient agrees to go over the instructions while at home for a better understanding. Patient also instructed to wear a mask in public after being tested for COVID-19. The opportunity to ask questions was provided.

## 2020-12-08 NOTE — Telephone Encounter (Signed)
Please advise 

## 2020-12-11 ENCOUNTER — Encounter: Payer: Medicare Other | Admitting: Orthopaedic Surgery

## 2020-12-11 NOTE — Telephone Encounter (Signed)
Lvm to daughter to cb to inform

## 2020-12-12 ENCOUNTER — Observation Stay (HOSPITAL_COMMUNITY)
Admission: RE | Admit: 2020-12-12 | Discharge: 2020-12-13 | Disposition: A | Discharge status: Home / Self Care | Payer: Medicare Other | Attending: Orthopaedic Surgery | Admitting: Orthopaedic Surgery

## 2020-12-12 ENCOUNTER — Encounter (HOSPITAL_COMMUNITY)
Admission: RE | Disposition: A | Discharge status: Home / Self Care | Payer: Self-pay | Source: Home / Self Care | Attending: Orthopaedic Surgery

## 2020-12-12 ENCOUNTER — Ambulatory Visit (HOSPITAL_COMMUNITY): Payer: Medicare Other | Admitting: Anesthesiology

## 2020-12-12 ENCOUNTER — Encounter (HOSPITAL_COMMUNITY): Payer: Self-pay | Admitting: Orthopaedic Surgery

## 2020-12-12 ENCOUNTER — Observation Stay (HOSPITAL_COMMUNITY): Payer: Medicare Other

## 2020-12-12 DIAGNOSIS — I10 Essential (primary) hypertension: Secondary | ICD-10-CM | POA: Insufficient documentation

## 2020-12-12 DIAGNOSIS — M21061 Valgus deformity, not elsewhere classified, right knee: Secondary | ICD-10-CM | POA: Insufficient documentation

## 2020-12-12 DIAGNOSIS — M1711 Unilateral primary osteoarthritis, right knee: Principal | ICD-10-CM

## 2020-12-12 DIAGNOSIS — Z96651 Presence of right artificial knee joint: Secondary | ICD-10-CM

## 2020-12-12 DIAGNOSIS — Z96641 Presence of right artificial hip joint: Secondary | ICD-10-CM | POA: Insufficient documentation

## 2020-12-12 HISTORY — PX: TOTAL KNEE ARTHROPLASTY: SHX125

## 2020-12-12 LAB — ABO/RH: ABO/RH(D): O POS

## 2020-12-12 SURGERY — ARTHROPLASTY, KNEE, TOTAL
Anesthesia: Monitor Anesthesia Care | Site: Knee | Laterality: Right

## 2020-12-12 MED ORDER — FENTANYL CITRATE (PF) 100 MCG/2ML IJ SOLN
INTRAMUSCULAR | Status: DC | PRN
  Administered 2020-12-12 (×3): 50 ug via INTRAVENOUS

## 2020-12-12 MED ORDER — FENTANYL CITRATE (PF) 100 MCG/2ML IJ SOLN
INTRAMUSCULAR | Status: AC
  Administered 2020-12-12: 50 ug via INTRAVENOUS
  Filled 2020-12-12: qty 2

## 2020-12-12 MED ORDER — POVIDONE-IODINE 10 % EX SWAB
2.0000 "application " | Freq: Once | CUTANEOUS | Status: AC
  Administered 2020-12-12: 2 via TOPICAL

## 2020-12-12 MED ORDER — CLINDAMYCIN PHOSPHATE 600 MG/50ML IV SOLN
600.0000 mg | Freq: Four times a day (QID) | INTRAVENOUS | Status: AC
  Administered 2020-12-12 – 2020-12-13 (×2): 600 mg via INTRAVENOUS
  Filled 2020-12-12 (×2): qty 50

## 2020-12-12 MED ORDER — BUPIVACAINE IN DEXTROSE 0.75-8.25 % IT SOLN
INTRATHECAL | Status: DC | PRN
  Administered 2020-12-12: 1.8 mL via INTRATHECAL

## 2020-12-12 MED ORDER — ASPIRIN 81 MG PO CHEW
81.0000 mg | CHEWABLE_TABLET | Freq: Two times a day (BID) | ORAL | Status: DC
  Administered 2020-12-12 – 2020-12-13 (×2): 81 mg via ORAL
  Filled 2020-12-12 (×2): qty 1

## 2020-12-12 MED ORDER — PANTOPRAZOLE SODIUM 40 MG PO TBEC
40.0000 mg | DELAYED_RELEASE_TABLET | Freq: Every day | ORAL | Status: DC
  Administered 2020-12-12 – 2020-12-13 (×2): 40 mg via ORAL
  Filled 2020-12-12: qty 1

## 2020-12-12 MED ORDER — ACETAMINOPHEN 500 MG PO TABS
1000.0000 mg | ORAL_TABLET | Freq: Once | ORAL | Status: AC
  Administered 2020-12-12: 1000 mg via ORAL
  Filled 2020-12-12: qty 2

## 2020-12-12 MED ORDER — SODIUM CHLORIDE 0.9 % IV SOLN: INTRAVENOUS | Status: DC

## 2020-12-12 MED ORDER — ONDANSETRON HCL 4 MG/2ML IJ SOLN: 4.0000 mg | Freq: Four times a day (QID) | INTRAMUSCULAR | Status: DC | PRN

## 2020-12-12 MED ORDER — LOSARTAN POTASSIUM 50 MG PO TABS
25.0000 mg | ORAL_TABLET | Freq: Every day | ORAL | Status: DC
  Administered 2020-12-13: 25 mg via ORAL
  Filled 2020-12-12: qty 1

## 2020-12-12 MED ORDER — DIPHENHYDRAMINE HCL 12.5 MG/5ML PO ELIX: 12.5000 mg | ORAL_SOLUTION | ORAL | Status: DC | PRN

## 2020-12-12 MED ORDER — MENTHOL 3 MG MT LOZG: 1.0000 | LOZENGE | OROMUCOSAL | Status: DC | PRN

## 2020-12-12 MED ORDER — METOCLOPRAMIDE HCL 5 MG/ML IJ SOLN: 5.0000 mg | Freq: Three times a day (TID) | INTRAMUSCULAR | Status: DC | PRN

## 2020-12-12 MED ORDER — HYDROMORPHONE HCL 1 MG/ML IJ SOLN
INTRAMUSCULAR | Status: AC
  Filled 2020-12-12: qty 1

## 2020-12-12 MED ORDER — METHOCARBAMOL 1000 MG/10ML IJ SOLN
500.0000 mg | Freq: Four times a day (QID) | INTRAVENOUS | Status: DC | PRN
  Filled 2020-12-12: qty 5

## 2020-12-12 MED ORDER — HYDROMORPHONE HCL 1 MG/ML IJ SOLN
0.2500 mg | INTRAMUSCULAR | Status: DC | PRN
  Administered 2020-12-12: 0.5 mg via INTRAVENOUS
  Administered 2020-12-12 (×2): 0.25 mg via INTRAVENOUS

## 2020-12-12 MED ORDER — PHENOL 1.4 % MT LIQD: 1.0000 | OROMUCOSAL | Status: DC | PRN

## 2020-12-12 MED ORDER — ALUM & MAG HYDROXIDE-SIMETH 200-200-20 MG/5ML PO SUSP: 30.0000 mL | ORAL | Status: DC | PRN

## 2020-12-12 MED ORDER — 0.9 % SODIUM CHLORIDE (POUR BTL) OPTIME
TOPICAL | Status: DC | PRN
  Administered 2020-12-12: 1000 mL

## 2020-12-12 MED ORDER — AMLODIPINE BESYLATE 10 MG PO TABS
10.0000 mg | ORAL_TABLET | Freq: Every day | ORAL | Status: DC
  Administered 2020-12-13: 10 mg via ORAL
  Filled 2020-12-12: qty 1

## 2020-12-12 MED ORDER — DEXAMETHASONE SODIUM PHOSPHATE 10 MG/ML IJ SOLN
INTRAMUSCULAR | Status: DC | PRN
  Administered 2020-12-12: 10 mg

## 2020-12-12 MED ORDER — FENTANYL CITRATE (PF) 250 MCG/5ML IJ SOLN
INTRAMUSCULAR | Status: AC
  Filled 2020-12-12: qty 5

## 2020-12-12 MED ORDER — OXYCODONE HCL 5 MG/5ML PO SOLN: 5.0000 mg | Freq: Once | ORAL | Status: DC | PRN

## 2020-12-12 MED ORDER — CHLORTHALIDONE 25 MG PO TABS
25.0000 mg | ORAL_TABLET | Freq: Every morning | ORAL | Status: DC
  Administered 2020-12-13: 25 mg via ORAL
  Filled 2020-12-12: qty 1

## 2020-12-12 MED ORDER — ALLOPURINOL 100 MG PO TABS
100.0000 mg | ORAL_TABLET | Freq: Every day | ORAL | Status: DC
  Administered 2020-12-13: 100 mg via ORAL
  Filled 2020-12-12: qty 1

## 2020-12-12 MED ORDER — ONDANSETRON HCL 4 MG/2ML IJ SOLN: 4.0000 mg | Freq: Once | INTRAMUSCULAR | Status: DC | PRN

## 2020-12-12 MED ORDER — HYDROMORPHONE HCL 1 MG/ML IJ SOLN: 0.5000 mg | INTRAMUSCULAR | Status: DC | PRN

## 2020-12-12 MED ORDER — POLYETHYLENE GLYCOL 3350 17 G PO PACK: 17.0000 g | PACK | Freq: Every day | ORAL | Status: DC | PRN

## 2020-12-12 MED ORDER — MIDAZOLAM HCL 2 MG/2ML IJ SOLN
INTRAMUSCULAR | Status: AC
  Filled 2020-12-12: qty 2

## 2020-12-12 MED ORDER — ACETAMINOPHEN 325 MG PO TABS: 325.0000 mg | ORAL_TABLET | Freq: Four times a day (QID) | ORAL | Status: DC | PRN

## 2020-12-12 MED ORDER — CLINDAMYCIN PHOSPHATE 900 MG/50ML IV SOLN
900.0000 mg | INTRAVENOUS | Status: AC
  Administered 2020-12-12: 900 mg via INTRAVENOUS
  Filled 2020-12-12: qty 50

## 2020-12-12 MED ORDER — PHENYLEPHRINE HCL-NACL 10-0.9 MG/250ML-% IV SOLN
INTRAVENOUS | Status: DC | PRN
  Administered 2020-12-12: 30 ug/min via INTRAVENOUS

## 2020-12-12 MED ORDER — MIDAZOLAM HCL 2 MG/2ML IJ SOLN
INTRAMUSCULAR | Status: AC
  Administered 2020-12-12: 1 mg via INTRAVENOUS
  Filled 2020-12-12: qty 2

## 2020-12-12 MED ORDER — METOCLOPRAMIDE HCL 5 MG PO TABS
5.0000 mg | ORAL_TABLET | Freq: Three times a day (TID) | ORAL | Status: DC | PRN
Start: 2020-12-12 — End: 2020-12-13

## 2020-12-12 MED ORDER — PROPOFOL 10 MG/ML IV BOLUS
INTRAVENOUS | Status: DC | PRN
  Administered 2020-12-12 (×2): 10 mg via INTRAVENOUS

## 2020-12-12 MED ORDER — ONDANSETRON HCL 4 MG PO TABS: 4.0000 mg | ORAL_TABLET | Freq: Four times a day (QID) | ORAL | Status: DC | PRN

## 2020-12-12 MED ORDER — DOCUSATE SODIUM 100 MG PO CAPS
100.0000 mg | ORAL_CAPSULE | Freq: Two times a day (BID) | ORAL | Status: DC
  Administered 2020-12-12 – 2020-12-13 (×2): 100 mg via ORAL
  Filled 2020-12-12 (×2): qty 1

## 2020-12-12 MED ORDER — MIDAZOLAM HCL 5 MG/5ML IJ SOLN
INTRAMUSCULAR | Status: DC | PRN
  Administered 2020-12-12: 2 mg via INTRAVENOUS

## 2020-12-12 MED ORDER — BUPIVACAINE HCL (PF) 0.5 % IJ SOLN
INTRAMUSCULAR | Status: DC | PRN
  Administered 2020-12-12: 30 mL via PERINEURAL

## 2020-12-12 MED ORDER — CHLORHEXIDINE GLUCONATE 0.12 % MT SOLN
15.0000 mL | Freq: Once | OROMUCOSAL | Status: AC
  Administered 2020-12-12: 15 mL via OROMUCOSAL
  Filled 2020-12-12: qty 15

## 2020-12-12 MED ORDER — TRANEXAMIC ACID-NACL 1000-0.7 MG/100ML-% IV SOLN
1000.0000 mg | INTRAVENOUS | Status: AC
  Administered 2020-12-12: 1000 mg via INTRAVENOUS
  Filled 2020-12-12: qty 100

## 2020-12-12 MED ORDER — ORAL CARE MOUTH RINSE: 15.0000 mL | Freq: Once | OROMUCOSAL | Status: AC

## 2020-12-12 MED ORDER — METHOCARBAMOL 500 MG PO TABS
500.0000 mg | ORAL_TABLET | Freq: Four times a day (QID) | ORAL | Status: DC | PRN
  Administered 2020-12-12 – 2020-12-13 (×2): 500 mg via ORAL
  Filled 2020-12-12 (×2): qty 1

## 2020-12-12 MED ORDER — PROPOFOL 10 MG/ML IV BOLUS
INTRAVENOUS | Status: AC
  Filled 2020-12-12: qty 20

## 2020-12-12 MED ORDER — MIDAZOLAM HCL 2 MG/2ML IJ SOLN: 1.0000 mg | Freq: Once | INTRAMUSCULAR | Status: AC

## 2020-12-12 MED ORDER — OXYCODONE HCL 5 MG PO TABS
5.0000 mg | ORAL_TABLET | ORAL | Status: DC | PRN
  Administered 2020-12-13: 5 mg via ORAL
  Filled 2020-12-12: qty 2

## 2020-12-12 MED ORDER — PRAVASTATIN SODIUM 40 MG PO TABS
40.0000 mg | ORAL_TABLET | Freq: Every day | ORAL | Status: DC
  Administered 2020-12-12: 40 mg via ORAL
  Filled 2020-12-12: qty 1

## 2020-12-12 MED ORDER — PROPOFOL 500 MG/50ML IV EMUL
INTRAVENOUS | Status: DC | PRN
  Administered 2020-12-12: 50 ug/kg/min via INTRAVENOUS

## 2020-12-12 MED ORDER — SODIUM CHLORIDE 0.9 % IR SOLN
Status: DC | PRN
  Administered 2020-12-12: 1000 mL

## 2020-12-12 MED ORDER — FENTANYL CITRATE (PF) 100 MCG/2ML IJ SOLN
50.0000 ug | Freq: Once | INTRAMUSCULAR | Status: AC
Start: 2020-12-12 — End: 2020-12-12

## 2020-12-12 MED ORDER — OXYCODONE HCL 5 MG PO TABS: 5.0000 mg | ORAL_TABLET | Freq: Once | ORAL | Status: DC | PRN

## 2020-12-12 MED ORDER — OXYCODONE HCL 5 MG PO TABS
10.0000 mg | ORAL_TABLET | ORAL | Status: DC | PRN
  Administered 2020-12-12: 15 mg via ORAL
  Administered 2020-12-12: 10 mg via ORAL
  Administered 2020-12-13: 15 mg via ORAL
  Filled 2020-12-12 (×3): qty 3

## 2020-12-12 MED ORDER — LACTATED RINGERS IV SOLN: INTRAVENOUS | Status: DC

## 2020-12-12 SURGICAL SUPPLY — 67 items
BANDAGE ESMARK 6X9 LF (GAUZE/BANDAGES/DRESSINGS) ×1 IMPLANT
BASEPLATE TIBIAL TRIATHALON 2 (Plate) ×2 IMPLANT
BLADE SAG 18X100X1.27 (BLADE) ×2 IMPLANT
BNDG ELASTIC 6X5.8 VLCR STR LF (GAUZE/BANDAGES/DRESSINGS) ×2 IMPLANT
BNDG ESMARK 6X9 LF (GAUZE/BANDAGES/DRESSINGS) ×2
BOWL SMART MIX CTS (DISPOSABLE) ×2 IMPLANT
CEMENT BONE SIMPLEX SPEEDSET (Cement) ×4 IMPLANT
COVER SURGICAL LIGHT HANDLE (MISCELLANEOUS) ×2 IMPLANT
COVER WAND RF STERILE (DRAPES) ×2 IMPLANT
CUFF TOURN SGL QUICK 34 (TOURNIQUET CUFF) ×1
CUFF TOURN SGL QUICK 42 (TOURNIQUET CUFF) IMPLANT
CUFF TRNQT CYL 34X4.125X (TOURNIQUET CUFF) ×1 IMPLANT
DRAPE EXTREMITY T 121X128X90 (DISPOSABLE) ×2 IMPLANT
DRAPE HALF SHEET 40X57 (DRAPES) ×2 IMPLANT
DRAPE U-SHAPE 47X51 STRL (DRAPES) ×2 IMPLANT
DRSG PAD ABDOMINAL 8X10 ST (GAUZE/BANDAGES/DRESSINGS) ×2 IMPLANT
DRSG XEROFORM 1X8 (GAUZE/BANDAGES/DRESSINGS) ×2 IMPLANT
DURAPREP 26ML APPLICATOR (WOUND CARE) ×2 IMPLANT
ELECT CAUTERY BLADE 6.4 (BLADE) ×2 IMPLANT
ELECT REM PT RETURN 9FT ADLT (ELECTROSURGICAL) ×2
ELECTRODE REM PT RTRN 9FT ADLT (ELECTROSURGICAL) ×1 IMPLANT
FACESHIELD WRAPAROUND (MASK) ×4 IMPLANT
FEMORAL PEG DISTAL FIXATION (Orthopedic Implant) ×2 IMPLANT
FEMORAL POST STABILIZED NO 3 (Orthopedic Implant) ×2 IMPLANT
GAUZE SPONGE 4X4 12PLY STRL (GAUZE/BANDAGES/DRESSINGS) ×2 IMPLANT
GAUZE SPONGE 4X4 12PLY STRL LF (GAUZE/BANDAGES/DRESSINGS) ×2 IMPLANT
GAUZE XEROFORM 1X8 LF (GAUZE/BANDAGES/DRESSINGS) ×2 IMPLANT
GLOVE ORTHO TXT STRL SZ7.5 (GLOVE) ×2 IMPLANT
GLOVE SRG 8 PF TXTR STRL LF DI (GLOVE) ×2 IMPLANT
GLOVE SURG ORTHO 8.0 STRL STRW (GLOVE) ×2 IMPLANT
GLOVE SURG UNDER POLY LF SZ8 (GLOVE) ×2
GOWN STRL REUS W/ TWL LRG LVL3 (GOWN DISPOSABLE) IMPLANT
GOWN STRL REUS W/ TWL XL LVL3 (GOWN DISPOSABLE) ×2 IMPLANT
GOWN STRL REUS W/TWL LRG LVL3 (GOWN DISPOSABLE)
GOWN STRL REUS W/TWL XL LVL3 (GOWN DISPOSABLE) ×2
HANDPIECE INTERPULSE COAX TIP (DISPOSABLE) ×1
IMMOBILIZER KNEE 22 UNIV (SOFTGOODS) ×2 IMPLANT
INSERT TIB BEAR PS SZ 2 13 (Miscellaneous) ×2 IMPLANT
KIT BASIN OR (CUSTOM PROCEDURE TRAY) ×2 IMPLANT
KIT TURNOVER KIT B (KITS) ×2 IMPLANT
MANIFOLD NEPTUNE II (INSTRUMENTS) ×2 IMPLANT
NEEDLE 18GX1X1/2 (RX/OR ONLY) (NEEDLE) IMPLANT
NS IRRIG 1000ML POUR BTL (IV SOLUTION) ×2 IMPLANT
PACK TOTAL JOINT (CUSTOM PROCEDURE TRAY) ×2 IMPLANT
PAD ABD 8X10 STRL (GAUZE/BANDAGES/DRESSINGS) ×2 IMPLANT
PAD ARMBOARD 7.5X6 YLW CONV (MISCELLANEOUS) ×2 IMPLANT
PADDING CAST COTTON 6X4 STRL (CAST SUPPLIES) ×2 IMPLANT
PATELLA TRIATHALON (Knees) ×2 IMPLANT
PIN FLUTED HEDLESS FIX 3.5X1/8 (PIN) ×2 IMPLANT
SET HNDPC FAN SPRY TIP SCT (DISPOSABLE) ×1 IMPLANT
SET PAD KNEE POSITIONER (MISCELLANEOUS) ×2 IMPLANT
STAPLER VISISTAT 35W (STAPLE) IMPLANT
STRIP CLOSURE SKIN 1/2X4 (GAUZE/BANDAGES/DRESSINGS) IMPLANT
SUCTION FRAZIER HANDLE 10FR (MISCELLANEOUS) ×1
SUCTION TUBE FRAZIER 10FR DISP (MISCELLANEOUS) ×1 IMPLANT
SUT MNCRL AB 4-0 PS2 18 (SUTURE) IMPLANT
SUT VIC AB 0 CT1 27 (SUTURE) ×1
SUT VIC AB 0 CT1 27XBRD ANBCTR (SUTURE) ×1 IMPLANT
SUT VIC AB 1 CT1 27 (SUTURE) ×2
SUT VIC AB 1 CT1 27XBRD ANBCTR (SUTURE) ×2 IMPLANT
SUT VIC AB 2-0 CT1 27 (SUTURE) ×2
SUT VIC AB 2-0 CT1 TAPERPNT 27 (SUTURE) ×2 IMPLANT
SYR 50ML LL SCALE MARK (SYRINGE) IMPLANT
TOWEL GREEN STERILE (TOWEL DISPOSABLE) ×2 IMPLANT
TOWEL GREEN STERILE FF (TOWEL DISPOSABLE) ×2 IMPLANT
TRAY CATH 16FR W/PLASTIC CATH (SET/KITS/TRAYS/PACK) IMPLANT
WRAP KNEE MAXI GEL POST OP (GAUZE/BANDAGES/DRESSINGS) ×2 IMPLANT

## 2020-12-12 NOTE — Op Note (Signed)
Jane Vasquez, Jane Vasquez MEDICAL RECORD NO: [id number] ACCOUNT NO: [id number] DATE OF BIRTH: Jan 26, 1946 FACILITY: [facility] LOCATION: [facility] PHYSICIAN: Vanita Panda. Magnus Ivan, MD  Operative Report   DATE OF PROCEDURE: 12/12/2020  PREOPERATIVE DIAGNOSES:  Primary osteoarthritis and degenerative joint disease, right knee, with valgus malalignment.  POSTOPERATIVE DIAGNOSES:  Primary osteoarthritis and degenerative joint disease, right knee, with valgus malalignment.  PROCEDURE:  Right total knee arthroplasty.  IMPLANTS:  Stryker Triathlon cemented knee system with size 3 femur, size 2 tibial tray, 13 mm fixed bearing polyethylene insert, size 29 patellar button.  SURGEON:  Vanita Panda. Magnus Ivan, MD  ASSISTANT:  Richardean Canal, PA-C  ANESTHESIA: 1.  Right lower extremity adductor canal block. 2.  Spinal. 3.  Sedation.  TOURNIQUET TIME:  Less than 1 hour.  BLOOD LOSS: Less 100 mL.  ANTIBIOTICS:  2 grams IV Ancef.  COMPLICATIONS:  None.  INDICATIONS:  The patient is a 75 year old female well known to me.  She has debilitating arthritis involving her right knee that has been worsening for many years now.  She has tried and failed all forms of conservative treatment.  At this point, her  right knee pain is detrimentally affecting her mobility, her quality of life and her activities of daily living to the point she does wish to proceed with a total knee arthroplasty.  We had a long and thorough discussion about the risk of acute blood  loss anemia, nerve and vessel injury, fracture, infection, DVT, implant failure and skin and soft tissue issues.  We talked about our goals being decrease pain, improve mobility and overall improve quality of life.  DESCRIPTION OF PROCEDURE:  After informed consent was obtained, appropriate right knee was marked.  An adductor canal block was obtained in the right lower extremity in the holding room.  She was then brought to the operating room and sat up on the   operating table where spinal anesthesia was obtained. She was laid in the supine position on the operating table.  A nonsterile tourniquet was placed around her upper right thigh and her right thigh, knee, leg, ankle and foot were prepped and draped with  DuraPrep and sterile drapes including a sterile stockinette.  Timeout was called, and she was identified correct patient, correct right knee.  We then used an Esmarch to wrap out the leg and tourniquet was inflated to 300 mm of pressure.  I then made a  direct midline incision over the patella and carried this proximally and distally.  We dissected down the knee joint and carried out a medial parapatellar arthrotomy, finding a very large joint effusion and significant osteophytes in all 3 compartments  of the knee and especially on the lateral aspect of her knee consistent with her valgus malalignment. With the knee in a flexed position, we removed remnants of ACL, PCL, medial and lateral meniscus and periarticular osteophytes around the knee.  We  set our extramedullary cutting guide for taking 9 mm off the high side, correcting for varus and valgus and neutral slope.  We made this cut without difficulty.  We then went to the femur and using the intramedullary guide for the femur, making our  distal femoral cut for a right knee at 5 degrees externally rotated and for an 8 mm distal femoral cut.  We did that cut without difficulty and brought the knee back down to full extension and with a 9 mm extension block, she actually hyperextended a  little. We then went back to the femur  and put our femoral sizing guide based off the epicondylar axis and Whiteside's line.  Based off of this, we chose a size 3 femur.  We put a 4-in-1 cutting block for a size 3 femur, made our anterior and posterior  cuts followed by our chamfer cuts.  We then made our femoral box cut.  Attention was then turned back to the tibia, with the tibial coverage assessed, we chose a size 2  tibial tray for coverage, setting the rotation off the tibial tubercle and the femur.   We made our keel punch off of this.  With a size 2 trial tibia, we trialed a 3 right femur and then we went from a 9 mm all the way to the 13 mm fixed bearing insert and we really liked the alignment and the stability with the 13 insert.  We then made  our patellar cut and drilled three holes for a size 29 patellar button.  With the patellar trial and all the other trial instrumentation in the knee, we put it through several cycles of motion.  We were pleased with range of motion, stability and  alignment.  We then removed all instrumentation from the knee and irrigated the knee with normal saline solution using pulsatile lavage.  We dried the knee real well and mixed our cement.  We then cemented our real Stryker Triathlon tibial tray size 2  followed by cementing our size 3 right femur.  We removed excess cement debris from the knee and placed our 13 mm fixed bearing polyethylene insert and then cemented our patellar button.  We then held the knee in a fully extended position and compressed  the knee to allow the cement to harden and cure.  Once it had done so, we let the tourniquet down.  Hemostasis was obtained with electrocautery.  We put the knee through several cycles of motion.  I was pleased with range of motion and stability.  We  then closed the arthrotomy with interrupted #1 Vicryl suture followed by 0 Vicryl to close the deep tissue and 2-0 Vicryl to close the subcutaneous tissue.  The skin was closed with staples.  Xeroform and Aquacel dressing was applied.  She was taken to  recovery room in stable condition with all final counts being correct.  No complications noted.  Of note, Rexene Edison, PA-C, did assist during the entire case and assistance was crucial for facilitating all aspects of this case.   SHW D: 12/12/2020 4:34:20 pm T: 12/12/2020 10:59:00 pm  JOB: [id number]/ [id number]

## 2020-12-12 NOTE — Anesthesia Postprocedure Evaluation (Signed)
Anesthesia Post Note  Patient: Jane Vasquez  Procedure(s) Performed: RIGHT TOTAL KNEE ARTHROPLASTY (Right: Knee)     Patient location during evaluation: PACU Anesthesia Type: Regional Level of consciousness: awake and alert Pain management: pain level controlled Vital Signs Assessment: post-procedure vital signs reviewed and stable Respiratory status: spontaneous breathing and respiratory function stable Cardiovascular status: blood pressure returned to baseline and stable Postop Assessment: spinal receding Anesthetic complications: no   No notable events documented.  Last Vitals:  Vitals:   12/12/20 1725 12/12/20 1740  BP: 134/74 (!) 142/87  Pulse: 75 80  Resp: 10 10  Temp:    SpO2: 97% 96%    Last Pain:  Vitals:   12/12/20 1740  TempSrc:   PainSc: 5                  [name] DANIEL

## 2020-12-12 NOTE — Brief Op Note (Signed)
12/12/2020  4:37 PM  PATIENT:  Rosalia Hammers  75 y.o. female  PRE-OPERATIVE DIAGNOSIS:  Osteoarthritis Right Knee  POST-OPERATIVE DIAGNOSIS:  Osteoarthritis Right Knee  PROCEDURE:  Procedure(s): RIGHT TOTAL KNEE ARTHROPLASTY (Right)  SURGEON:  Surgeon(s) and Role:    Kathryne Hitch, MD - Primary  PHYSICIAN ASSISTANT:  Rexene Edison, PA-C  ANESTHESIA:   regional and spinal  EBL:  20 mL   COUNTS:  YES  TOURNIQUET:   Total Tourniquet Time Documented: Thigh (Right) - 57 minutes Total: Thigh (Right) - 57 minutes   DICTATION: .Other Dictation: Dictation Number [id number]  PLAN OF CARE: Admit for overnight observation  PATIENT DISPOSITION:  PACU - hemodynamically stable.   Delay start of Pharmacological VTE agent (>24hrs) due to surgical blood loss or risk of bleeding: no

## 2020-12-12 NOTE — Anesthesia Procedure Notes (Signed)
Anesthesia Regional Block: Adductor canal block   Pre-Anesthetic Checklist: , timeout performed,  Correct Patient, Correct Site, Correct Laterality,  Correct Procedure, Correct Position, site marked,  Risks and benefits discussed,  Surgical consent,  Pre-op evaluation,  At surgeon's request and post-op pain management  Laterality: Right  Prep: Maximum Sterile Barrier Precautions used, chloraprep       Needles:  Injection technique: Single-shot  Needle Type: Echogenic Stimulator Needle     Needle Length: 9cm  Needle Gauge: 22     Additional Needles:   Procedures:,,,, ultrasound used (permanent image in chart),,    Narrative:  Start time: 12/12/2020 2:00 PM End time: 12/12/2020 2:10 PM Injection made incrementally with aspirations every 5 mL.  Performed by: Personally  Anesthesiologist: Lannie Fields, DO  Additional Notes: Monitors applied. No increased pain on injection. No increased resistance to injection. Injection made in 5cc increments. Good needle visualization. Patient tolerated procedure well.

## 2020-12-12 NOTE — Anesthesia Preprocedure Evaluation (Addendum)
Anesthesia Evaluation  Patient identified by MRN, date of birth, ID band Patient awake    Reviewed: Allergy & Precautions, NPO status , Patient's Chart, lab work & pertinent test results  Airway Mallampati: II  TM Distance: >3 FB Neck ROM: Full    Dental  (+) Edentulous Upper, Edentulous Lower   Pulmonary neg pulmonary ROS,    Pulmonary exam normal breath sounds clear to auscultation       Cardiovascular hypertension, Pt. on medications Normal cardiovascular exam Rhythm:Regular Rate:Normal     Neuro/Psych negative neurological ROS  negative psych ROS   GI/Hepatic negative GI ROS, Neg liver ROS,   Endo/Other  negative endocrine ROS  Renal/GU negative Renal ROS  negative genitourinary   Musculoskeletal  (+) Arthritis , Osteoarthritis,  R knee OA    Abdominal   Peds  Hematology negative hematology ROS (+) hct 37, plt 254   Anesthesia Other Findings   Reproductive/Obstetrics negative OB ROS                            Anesthesia Physical Anesthesia Plan  ASA: 2  Anesthesia Plan: Spinal, Regional and MAC   Post-op Pain Management:  Regional for Post-op pain   Induction:   PONV Risk Score and Plan: 2 and Propofol infusion and TIVA  Airway Management Planned: Natural Airway and Nasal Cannula  Additional Equipment: None  Intra-op Plan:   Post-operative Plan:   Informed Consent: I have reviewed the patients History and Physical, chart, labs and discussed the procedure including the risks, benefits and alternatives for the proposed anesthesia with the patient or authorized representative who has indicated his/her understanding and acceptance.     Dental advisory given  Plan Discussed with: CRNA  Anesthesia Plan Comments:        Anesthesia Quick Evaluation

## 2020-12-12 NOTE — Anesthesia Procedure Notes (Signed)
Spinal  Patient location during procedure: OR Start time: 12/12/2020 3:00 PM End time: 12/12/2020 3:05 PM Reason for block: surgical anesthesia Preanesthetic Checklist Completed: patient identified, IV checked, site marked, risks and benefits discussed, surgical consent, monitors and equipment checked, pre-op evaluation and timeout performed Spinal Block Patient position: sitting Prep: DuraPrep Patient monitoring: heart rate, cardiac monitor, continuous pulse ox and blood pressure Approach: midline Location: L3-4 Injection technique: single-shot Needle Needle type: Sprotte  Needle gauge: 24 G Needle length: 9 cm Assessment Sensory level: T4 Events: CSF return

## 2020-12-12 NOTE — Transfer of Care (Signed)
Immediate Anesthesia Transfer of Care Note  Patient: Jane Vasquez  Procedure(s) Performed: RIGHT TOTAL KNEE ARTHROPLASTY (Right: Knee)  Patient Location: PACU  Anesthesia Type:MAC, Regional and Spinal  Level of Consciousness: awake, alert  and oriented  Airway & Oxygen Therapy: Patient Spontanous Breathing  Post-op Assessment: Report given to RN  Post vital signs: Reviewed and stable  Last Vitals:  Vitals Value Taken Time  BP 137/82 12/12/20 1708  Temp    Pulse 82 12/12/20 1710  Resp 9 12/12/20 1710  SpO2 97 % 12/12/20 1710  Vitals shown include unvalidated device data.  Last Pain:  Vitals:   12/12/20 1327  TempSrc:   PainSc: 0-No pain         Complications: No notable events documented.

## 2020-12-12 NOTE — H&P (Signed)
TOTAL KNEE ADMISSION H&P  Patient is being admitted for right total knee arthroplasty.  Subjective:  Chief Complaint:right knee pain.  HPI: Jane Vasquez, 75 y.o. female, has a history of pain and functional disability in the right knee due to arthritis and has failed non-surgical conservative treatments for greater than 12 weeks to includeNSAID's and/or analgesics, corticosteriod injections, viscosupplementation injections, flexibility and strengthening excercises, use of assistive devices, and activity modification.  Onset of symptoms was gradual, starting 3 years ago with gradually worsening course since that time. The patient noted no past surgery on the right knee(s).  Patient currently rates pain in the right knee(s) at 10 out of 10 with activity. Patient has night pain, worsening of pain with activity and weight bearing, pain that interferes with activities of daily living, pain with passive range of motion, crepitus, and joint swelling.  Patient has evidence of subchondral sclerosis, periarticular osteophytes, and joint space narrowing by imaging studies. There is no active infection.  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   Unilateral primary osteoarthritis, right hip 04/22/2019   Unilateral primary osteoarthritis, left hip 06/25/2018   Past Medical History:  Diagnosis Date   Gout    Gout    HTN (hypertension)    Osteoarthritis    left knee , right hip     Past Surgical History:  Procedure Laterality Date   APPENDECTOMY  age 53   JOINT REPLACEMENT     TOTAL HIP ARTHROPLASTY Right 04/23/2019   Procedure: RIGHT TOTAL HIP ARTHROPLASTY ANTERIOR APPROACH;  Surgeon: Kathryne Hitch, MD;  Location: [facility];  Service: Orthopedics;  Laterality: Right;    Current Facility-Administered Medications  Medication Dose Route  Frequency Provider Last Rate Last Admin   [START ON 12/13/2020] clindamycin (CLEOCIN) IVPB 900 mg  900 mg Intravenous On Call to OR Kirtland Bouchard, PA-C       fentaNYL (SUBLIMAZE) 100 MCG/2ML injection            lactated ringers infusion   Intravenous Continuous Dorris Singh, MD 10 mL/hr at 12/12/20 1351 New Bag at 12/12/20 1351   midazolam (VERSED) 2 MG/2ML injection            tranexamic acid (CYKLOKAPRON) IVPB 1,000 mg  1,000 mg Intravenous To OR Kirtland Bouchard, PA-C       Allergies  Allergen Reactions   Codeine Hives   Shellfish-Derived Products Other (See Comments)    Seafood Gout   Elemental Sulfur Hives and Rash   Penicillins Hives and Rash    Did it involve swelling of the face/tongue/throat, SOB, or low BP? No Did it involve sudden or severe rash/hives, skin peeling, or any reaction on the inside of your mouth or nose? No Did you need to seek medical attention at a hospital or doctor's office? No When did it last happen?       If all above answers are "NO", may proceed with cephalosporin use.     Social History   Tobacco Use   Smoking status: Never   Smokeless tobacco: Current    Types: Snuff  Substance Use Topics   Alcohol use: Not Currently    History reviewed. No pertinent family history.   Review of Systems  Musculoskeletal:  Positive for gait problem and joint swelling.  All other systems reviewed and are negative.  Objective:  Physical Exam Vitals reviewed.  Constitutional:      Appearance: Normal appearance.  HENT:     Head: Normocephalic and atraumatic.  Eyes:     Extraocular Movements: Extraocular movements intact.     Pupils: Pupils are equal, round, and reactive to light.  Cardiovascular:     Rate and Rhythm: Normal rate.     Pulses: Normal pulses.  Pulmonary:     Effort: Pulmonary effort is normal.  Abdominal:     Palpations: Abdomen is soft.  Musculoskeletal:     Cervical back: Normal range of motion.     Right knee: Effusion, bony  tenderness and crepitus present. Decreased range of motion. Tenderness present over the medial joint line, lateral joint line and patellar tendon. Abnormal alignment.  Neurological:     Mental Status: She is alert and oriented to person, place, and time.    Vital signs in last 24 hours: Temp:  [98.7 F (37.1 C)] 98.7 F (37.1 C) (06/14 1307) Pulse Rate:  [96] 96 (06/14 1307) Resp:  [18] 18 (06/14 1307) BP: (155)/(72) 155/72 (06/14 1307) SpO2:  [98 %] 98 % (06/14 1307)  Labs:   Estimated body mass index is 26.64 kg/m as calculated from the following:   Height as of 12/08/20: 5\' 3"  (1.6 m).   Weight as of 12/08/20: 68.2 kg.   Imaging Review Plain radiographs demonstrate severe degenerative joint disease of the right knee(s). The overall alignment ismild valgus. The bone quality appears to be good for age and reported activity level.      Assessment/Plan:  End stage arthritis, right knee   The patient history, physical examination, clinical judgment of the provider and imaging studies are consistent with end stage degenerative joint disease of the right knee(s) and total knee arthroplasty is deemed medically necessary. The treatment options including medical management, injection therapy arthroscopy and arthroplasty were discussed at length. The risks and benefits of total knee arthroplasty were presented and reviewed. The risks due to aseptic loosening, infection, stiffness, patella tracking problems, thromboembolic complications and other imponderables were discussed. The patient acknowledged the explanation, agreed to proceed with the plan and consent was signed. Patient is being admitted for inpatient treatment for surgery, pain control, PT, OT, prophylactic antibiotics, VTE prophylaxis, progressive ambulation and ADL's and discharge planning. The patient is planning to be discharged home with home health services

## 2020-12-13 ENCOUNTER — Encounter (HOSPITAL_COMMUNITY): Payer: Self-pay | Admitting: Orthopaedic Surgery

## 2020-12-13 DIAGNOSIS — M1711 Unilateral primary osteoarthritis, right knee: Secondary | ICD-10-CM | POA: Diagnosis not present

## 2020-12-13 LAB — BASIC METABOLIC PANEL
Anion gap: 12 (ref 5–15)
BUN: 11 mg/dL (ref 8–23)
CO2: 25 mmol/L (ref 22–32)
Calcium: 9.1 mg/dL (ref 8.9–10.3)
Chloride: 99 mmol/L (ref 98–111)
Creatinine, Ser: 0.76 mg/dL (ref 0.44–1.00)
GFR, Estimated: >60 mL/min (ref >60)
Glucose, Bld: 167 mg/dL — ABNORMAL HIGH (ref 70–99)
Potassium: 3.3 mmol/L — ABNORMAL LOW (ref 3.5–5.1)
Sodium: 136 mmol/L (ref 135–145)

## 2020-12-13 LAB — CBC
HCT: 29.7 % — ABNORMAL LOW (ref 36.0–46.0)
Hemoglobin: 10.3 g/dL — ABNORMAL LOW (ref 12.0–15.0)
MCH: 26.8 pg (ref 26.0–34.0)
MCHC: 34.7 g/dL (ref 30.0–36.0)
MCV: 77.3 fL — ABNORMAL LOW (ref 80.0–100.0)
Platelets: 227 10*3/uL (ref 150–400)
RBC: 3.84 MIL/uL — ABNORMAL LOW (ref 3.87–5.11)
RDW: 14.1 % (ref 11.5–15.5)
WBC: 13.3 10*3/uL — ABNORMAL HIGH (ref 4.0–10.5)
nRBC: 0 % (ref 0.0–0.2)

## 2020-12-13 MED ORDER — OXYCODONE HCL 5 MG PO TABS: 5.0000 mg | ORAL_TABLET | ORAL | 0 refills | Status: DC | PRN

## 2020-12-13 MED ORDER — ASPIRIN 81 MG PO CHEW: 81.0000 mg | CHEWABLE_TABLET | Freq: Two times a day (BID) | ORAL | 0 refills | Status: DC

## 2020-12-13 MED ORDER — ASPIRIN 81 MG PO CHEW: 81.0000 mg | CHEWABLE_TABLET | Freq: Two times a day (BID) | ORAL | 0 refills | Status: AC

## 2020-12-13 NOTE — Evaluation (Signed)
Physical Therapy Evaluation Patient Details Name: Jane Vasquez MRN: [id number] DOB: [birth year] Today's Date: 12/13/2020   History of Present Illness  Pt is a 75 y.o. female who presented 6/14 with a history of pain and functional disability in the right knee due to arthritis and has failed non-surgical conservative treatments for greater than 12 weeks to includeNSAID's and/or analgesics, corticosteriod injections, viscosupplementation injections, flexibility and strengthening excercises, use of assistive devices, and activity modification. S/p R TKA 6/14. PMH: gout, HTN, s/p R THA 2020, and OA.   Clinical Impression  Pt presents with condition above and deficits mentioned below, see PT Problem List. PTA, she was independent and living alone but the plan is to d/c home with her daughters to provide her with 24/7 assistance until she improves to return home alone safely again. She will be living with her daughters on the main floor of a multi-level house with 3 STE without rails. Currently, pt is demonstrating pain and edema in her R knee along with limited strength and ROM. Educated pt and her daughters on the TKA HEP handout, with pt performing several reps of each exercise to encourage learning. Pt is currently requiring minA for transfers and min guard assist for short household mobility with a RW. Will plan to complete stair training next session. Pt would benefit from HHPT and HHOT services upon d/c to maximize her safety and independence with all functional mobility and ADLs and promote return to PLOF. Will continue to follow acutely.    Follow Up Recommendations Home health PT;Supervision for mobility/OOB    Equipment Recommendations  Rolling walker with 5" wheels;3in1 (PT)    Recommendations for Other Services OT consult     Precautions / Restrictions Precautions Precautions: Fall;Knee Precaution Booklet Issued: Yes (comment) Precaution Comments: HOH; provided TKA exercises handout and  reviewed Restrictions Weight Bearing Restrictions: Yes RLE Weight Bearing: Weight bearing as tolerated      Mobility  Bed Mobility Overal bed mobility: Independent             General bed mobility comments: Pt able to exit R EOB with bed flat and without use of rails with increased time without physical assistance.    Transfers Overall transfer level: Needs assistance Equipment used: Rolling walker (2 wheeled) Transfers: Sit to/from Stand Sit to Stand: Min assist         General transfer comment: MinA to power up and steady to stand from EOB > RW, requiring increased time and repeated cues to push up from bed then walk feet posteriorly and transition hands to RW. Needs assistance to keep anterior aspect of RW on ground, educated pt's daughters on this.  Ambulation/Gait Ambulation/Gait assistance: Min guard Gait Distance (Feet): 37 Feet Assistive device: Rolling walker (2 wheeled) Gait Pattern/deviations: Step-through pattern;Decreased stance time - right;Antalgic;Decreased stride length;Trunk flexed Gait velocity: reduced Gait velocity interpretation: <1.31 ft/sec, indicative of household ambulator General Gait Details: Pt with trunk flexed posture, looking down at feet often, needing cues to correct. Pt with R antalgic gait pattern, needing cues to be WBAT. No LOB, min guard for safety.  Stairs            Wheelchair Mobility    Modified Rankin (Stroke Patients Only)       Balance Overall balance assessment: Needs assistance Sitting-balance support: No upper extremity supported;Feet supported Sitting balance-Leahy Scale: Fair Sitting balance - Comments: No LOB and no UE support sitting statically EOB, supervision for safety.   Standing balance support: Bilateral upper extremity  supported;During functional activity Standing balance-Leahy Scale: Poor Standing balance comment: Reliant on bil UE support for mobility, no LOB.                              Pertinent Vitals/Pain Pain Assessment: 0-10 Pain Score: 6  Pain Location: R hip/knee Pain Descriptors / Indicators: Discomfort;Aching;Grimacing;Guarding Pain Intervention(s): Limited activity within patient's tolerance;Monitored during session;Repositioned;Premedicated before session    Home Living Family/patient expects to be discharged to:: Private residence Living Arrangements: Children Available Help at Discharge: Family;Available 24 hours/day Type of Home: House Home Access: Stairs to enter Entrance Stairs-Rails: None Entrance Stairs-Number of Steps: 3 Home Layout: Two level;Able to live on main level with bedroom/bathroom Home Equipment: Shower seat - built in;Hand held shower head      Prior Function Level of Independence: Independent         Comments: Pt drives. Pt manages own meds and finances. Normally lives alone but will d/c with daughter, above info is for daughter's home.     Hand Dominance   Dominant Hand: Right    Extremity/Trunk Assessment   Upper Extremity Assessment Upper Extremity Assessment: Overall WFL for tasks assessed    Lower Extremity Assessment Lower Extremity Assessment: RLE deficits/detail RLE Deficits / Details: Edema noted with decreased AROM/PROM in R knee, limited to ~ -10 degrees extension AAROM and up to ~80 degrees flexion AAROM; gross weakness likely due to pain    Cervical / Trunk Assessment Cervical / Trunk Assessment: Kyphotic  Communication   Communication: HOH  Cognition Arousal/Alertness: Awake/alert Behavior During Therapy: WFL for tasks assessed/performed Overall Cognitive Status: Within Functional Limits for tasks assessed                                 General Comments: A&Ox4.      General Comments      Exercises Total Joint Exercises Ankle Circles/Pumps: AROM;Right;10 reps;Supine Quad Sets: AROM;Right;Supine;Other reps (comment) (x2 reps) Towel Squeeze: AROM;Both;Supine;Other reps  (comment) (x2 reps) Short Arc Quad: Right;AAROM;Other reps (comment);Supine (x2 reps) Heel Slides: AAROM;Right;Other reps (comment);Supine (x2 reps) Hip ABduction/ADduction: AAROM;Right;Other reps (comment);Supine (x2 reps) Straight Leg Raises: AAROM;Right;Other reps (comment);Supine (x2 reps) Long Arc Quad: AAROM;Right;Other reps (comment);Seated (x2 reps) Knee Flexion: AAROM;Right;Other reps (comment);Seated (x4 reps)   Assessment/Plan    PT Assessment Patient needs continued PT services  PT Problem List Decreased strength;Decreased range of motion;Decreased activity tolerance;Decreased balance;Decreased mobility;Decreased coordination;Decreased knowledge of use of DME;Decreased knowledge of precautions;Pain       PT Treatment Interventions DME instruction;Gait training;Functional mobility training;Stair training;Therapeutic exercise;Therapeutic activities;Balance training;Neuromuscular re-education;Patient/family education    PT Goals (Current goals can be found in the Care Plan section)  Acute Rehab PT Goals Patient Stated Goal: to improve PT Goal Formulation: With patient/family Time For Goal Achievement: 12/27/20 Potential to Achieve Goals: Good    Frequency 7X/week   Barriers to discharge        Co-evaluation               AM-PAC PT "6 Clicks" Mobility  Outcome Measure Help needed turning from your back to your side while in a flat bed without using bedrails?: None Help needed moving from lying on your back to sitting on the side of a flat bed without using bedrails?: None Help needed moving to and from a bed to a chair (including a wheelchair)?: A Little Help needed standing up from  a chair using your arms (e.g., wheelchair or bedside chair)?: A Little Help needed to walk in hospital room?: A Little Help needed climbing 3-5 steps with a railing? : A Lot 6 Click Score: 19    End of Session Equipment Utilized During Treatment: Gait belt Activity Tolerance:  Patient tolerated treatment well Patient left: in chair;with call bell/phone within reach;with chair alarm set;with family/visitor present Nurse Communication: Mobility status PT Visit Diagnosis: Unsteadiness on feet (R26.81);Other abnormalities of gait and mobility (R26.89);Difficulty in walking, not elsewhere classified (R26.2);Pain;Muscle weakness (generalized) (M62.81) Pain - Right/Left: Right Pain - part of body: Knee    Time: 5537-4827 PT Time Calculation (min) (ACUTE ONLY): 39 min   Charges:   PT Evaluation $PT Eval Moderate Complexity: 1 Mod PT Treatments $Therapeutic Exercise: 8-22 mins $Therapeutic Activity: 8-22 mins        Raymond Gurney, PT, DPT Acute Rehabilitation Services  Pager: [phone] Office: [phone]   Jewel Baize 12/13/2020, 10:18 AM

## 2020-12-13 NOTE — Discharge Instructions (Signed)

## 2020-12-13 NOTE — TOC Transition Note (Signed)
Transition of Care [facility]) - CM/SW Discharge Note   Patient Details  Name: Jane Vasquez MRN: [id number] Date of Birth: 10/21/1945  [facility] CM/SW Contact:  Ralene Bathe, LCSWA Phone Number: 12/13/2020, 10:39 AM   Clinical Narrative:    Patient will DC to: home with home health Anticipated DC date: 12/13/2020 Family notified: Yes Transport by: Family   Per MD patient ready for DC home with home health.  CSW received a consult for home health needs at d/c.  CSW spoke with the patient and offered choice of agencies to provide DME and home health.  Patient reported that she will be receiving home health services with [facility] and does not have a preference for the agency who will provide the DME.  CSW verified the patient's PCP, address, and phone number.  Patient reported that she will be staying with her daughter, Jane Vasquez, as she heals at [address] [place], [place].  The patient's daughter who was in the patient's room during this encounter, Gabriel Rung, verified this.    The patient reported needing the DME recommended by PT.  CSW contacted Stacy with [facility] and verified that they will be providing home health services.  CSW spoke with Velna Hatchet at [facility] and requested the DME.  DME will be delivered to the patient's room prior to d/c.  CSW will sign off for now as social work intervention is no longer needed. Please consult [place] again if new needs arise.     Final next level of care: Home w Home Health Services Barriers to Discharge: No Barriers Identified   Patient Goals and CMS Choice Patient states their goals for this hospitalization and ongoing recovery are:: To go home with her daughter and heal CMS Medicare.gov Compare Post Acute Care list provided to:: Patient Choice offered to / list presented to : Patient  Discharge Placement                  Name of family member notified: Gabriel Rung, daughter    Discharge Plan and Services                 DME Arranged: 3-N-1, Walker rolling DME Agency: [facility] Date DME Agency Contacted: 12/13/20 Time DME Agency Contacted: 1038 Representative spoke with at DME Agency: Velna Hatchet HH Arranged: PT, OT HH Agency: [facility] Date [facility] Agency Contacted: 12/13/20 Time HH Agency Contacted: 1038 Representative spoke with at [facility] Agency: Insurance risk surveyor  Social Determinants of Health (SDOH) Interventions     Readmission Risk Interventions No flowsheet data found.

## 2020-12-13 NOTE — Discharge Summary (Signed)
Patient ID: Jane Vasquez MRN: [id number] DOB/AGE: 1946-04-01 75 y.o.  Admit date: 12/12/2020 Discharge date: 12/13/2020  Admission Diagnoses:  Principal Problem:   Arthritis of right knee Active Problems:   Status post total right knee replacement   Discharge Diagnoses:  Same  Past Medical History:  Diagnosis Date   Gout    Gout    HTN (hypertension)    Osteoarthritis    left knee , right hip     Surgeries: Procedure(s): RIGHT TOTAL KNEE ARTHROPLASTY on 12/12/2020   Consultants:   Discharged Condition: Improved  Hospital Course: Jane Vasquez is an 75 y.o. female who was admitted 12/12/2020 for operative treatment ofArthritis of right knee. Patient has severe unremitting pain that affects sleep, daily activities, and work/hobbies. After pre-op clearance the patient was taken to the operating room on 12/12/2020 and underwent  Procedure(s): RIGHT TOTAL KNEE ARTHROPLASTY.    Patient was given perioperative antibiotics:  Anti-infectives (From admission, onward)    Start     Dose/Rate Route Frequency Ordered Stop   12/13/20 0600  clindamycin (CLEOCIN) IVPB 900 mg        900 mg 100 mL/hr over 30 Minutes Intravenous On call to O.R. 12/12/20 1303 12/12/20 1510   12/12/20 2030  clindamycin (CLEOCIN) IVPB 600 mg        600 mg 100 mL/hr over 30 Minutes Intravenous Every 6 hours 12/12/20 1938 12/13/20 0607        Patient was given sequential compression devices, early ambulation, and chemoprophylaxis to prevent DVT.  Patient benefited maximally from hospital stay and there were no complications.    Recent vital signs: Patient Vitals for the past 24 hrs:  BP Temp Temp src Pulse Resp SpO2  12/13/20 0817 (!) 152/77 98.9 F (37.2 C) Oral 94 17 98 %  12/13/20 0600 (!) 142/90 98.2 F (36.8 C) Oral 88 16 97 %  12/12/20 2133 (!) 148/98 98.4 F (36.9 C) Oral 94 16 97 %  12/12/20 1849 135/73 97.6 F (36.4 C) Oral 90 -- 100 %  12/12/20 1825 138/80 98 F (36.7 C) -- 81 10 96 %   12/12/20 1810 (!) 151/80 -- -- 78 11 97 %  12/12/20 1755 (!) 144/78 -- -- 76 (!) 8 95 %  12/12/20 1740 (!) 142/87 -- -- 80 10 96 %  12/12/20 1725 134/74 -- -- 75 10 97 %  12/12/20 1710 137/82 98.4 F (36.9 C) -- 85 14 99 %  12/12/20 1410 138/68 -- -- 84 13 100 %  12/12/20 1405 -- -- -- 78 13 100 %  12/12/20 1400 -- -- -- 78 14 100 %  12/12/20 1355 -- -- -- 84 15 100 %  12/12/20 1307 (!) 155/72 98.7 F (37.1 C) Oral 96 18 98 %     Recent laboratory studies:  Recent Labs    12/13/20 0512  WBC 13.3*  HGB 10.3*  HCT 29.7*  PLT 227  NA 136  K 3.3*  CL 99  CO2 25  BUN 11  CREATININE 0.76  GLUCOSE 167*  CALCIUM 9.1     Discharge Medications:   Allergies as of 12/13/2020       Reactions   Codeine Hives   Shellfish-derived Products Other (See Comments)   Seafood Gout   Elemental Sulfur Hives, Rash   Penicillins Hives, Rash   Did it involve swelling of the face/tongue/throat, SOB, or low BP? No Did it involve sudden or severe rash/hives, skin peeling, or any reaction on the  inside of your mouth or nose? No Did you need to seek medical attention at a hospital or doctor's office? No When did it last happen?       If all above answers are "NO", may proceed with cephalosporin use.        Medication List     STOP taking these medications    HYDROcodone-acetaminophen 5-325 MG tablet Commonly known as: NORCO/VICODIN       TAKE these medications    allopurinol 100 MG tablet Commonly known as: ZYLOPRIM Take 100 mg by mouth daily.   amLODipine 10 MG tablet Commonly known as: NORVASC Take 10 mg by mouth daily.   ammonium lactate 12 % lotion Commonly known as: LAC-HYDRIN Apply 1 application topically daily.   ammonium lactate 12 % cream Commonly known as: AMLACTIN Apply 1 application topically daily.   aspirin 81 MG chewable tablet Chew 1 tablet (81 mg total) by mouth 2 (two) times daily. What changed: when to take this   CALCIUM 1200 PO Take 600 mg by  mouth daily.   chlorthalidone 25 MG tablet Commonly known as: HYGROTON Take 25 mg by mouth every morning.   losartan 25 MG tablet Commonly known as: COZAAR Take 25 mg by mouth daily.   oxyCODONE 5 MG immediate release tablet Commonly known as: Oxy IR/ROXICODONE Take 1-2 tablets (5-10 mg total) by mouth every 4 (four) hours as needed for moderate pain (pain score 4-6).   pravastatin 40 MG tablet Commonly known as: PRAVACHOL Take 40 mg by mouth at bedtime.               Durable Medical Equipment  (From admission, onward)           Start     Ordered   12/12/20 1939  DME 3 n 1  Once        12/12/20 1938   12/12/20 1939  DME Walker rolling  Once       Question Answer Comment  Walker: With 5 Inch Wheels   Patient needs a walker to treat with the following condition Status post right knee replacement      12/12/20 1938            Diagnostic Studies: DG Knee Right Port  Result Date: 12/12/2020 CLINICAL DATA:  Postop EXAM: PORTABLE RIGHT KNEE - 1-2 VIEW COMPARISON:  09/20/2020 FINDINGS: Interval right knee replacement with intact hardware and normal alignment. No fracture. Gas in the soft tissues consistent with recent surgery IMPRESSION: Interval right knee replacement with expected postsurgical change Electronically Signed   By: Jasmine Pang M.D.   On: 12/12/2020 17:45    Disposition: Discharge disposition: 01-Home or Self Care          Follow-up Information     Kathryne Hitch, MD Follow up in 2 week(s).   Specialty: Orthopedic Surgery Contact information: [address] [place] [address] [phone]         [facility] Follow up.   Specialty: Home Health Services Why: [facility] will be providing home health services.  The agency will contact you within 48 hours to schedule. Contact information: [address] [place] [address] [phone]         [facility] Follow up.    Why: [facility] will be providing the Durable Medical Equipment.  Contact the agency at the number above with any equipment concerns. Contact information: [address] [place] [address] [phone]  Signed: Kathryne Hitch 12/13/2020, 11:48 AM

## 2020-12-13 NOTE — Progress Notes (Signed)
  OT Cancellation Note  Patient Details Name: Jane Vasquez MRN: [id number] DOB: [birth year]   Cancelled Treatment:    Reason Eval/Treat Not Completed: OT screened, no needs identified, will sign off. Pt going home with family care and no need identified.   Flora Lipps, OTR/L Acute Rehabilitation Services Pager: [phone] Office: [phone]   Flora Lipps 12/13/2020, 2:50 PM

## 2020-12-13 NOTE — Progress Notes (Signed)
Subjective: 1 Day Post-Op Procedure(s) (LRB): RIGHT TOTAL KNEE ARTHROPLASTY (Right) Patient reports pain as moderate.  Daughter at the bedside and states her mother is doing well and can probably go home later today.  Minimal acute blood loss anemia from surgery, but asymptomatic.  Objective: Vital signs in last 24 hours: Temp:  [97.6 F (36.4 C)-98.7 F (37.1 C)] 98.2 F (36.8 C) (06/15 0600) Pulse Rate:  [75-96] 88 (06/15 0600) Resp:  [8-18] 16 (06/15 0600) BP: (134-155)/(68-98) 142/90 (06/15 0600) SpO2:  [95 %-100 %] 97 % (06/15 0600)  Intake/Output from previous day: 06/14 0701 - 06/15 0700 In: 1650 [I.V.:1500; IV Piggyback:150] Out: 1020 [Urine:1000; Blood:20] Intake/Output this shift: No intake/output data recorded.  Recent Labs    12/13/20 0512  HGB 10.3*   Recent Labs    12/13/20 0512  WBC 13.3*  RBC 3.84*  HCT 29.7*  PLT 227   Recent Labs    12/13/20 0512  NA 136  K 3.3*  CL 99  CO2 25  BUN 11  CREATININE 0.76  GLUCOSE 167*  CALCIUM 9.1   No results for input(s): LABPT, INR in the last 72 hours.  Sensation intact distally Intact pulses distally Dorsiflexion/Plantar flexion intact Incision: scant drainage Compartment soft   Assessment/Plan: 1 Day Post-Op Procedure(s) (LRB): RIGHT TOTAL KNEE ARTHROPLASTY (Right) Up with therapy Discharge home with home health this afternoon.      Kathryne Hitch 12/13/2020, 7:59 AM

## 2020-12-13 NOTE — Progress Notes (Signed)
Physical Therapy Treatment Patient Details Name: Jane Vasquez MRN: [id number] DOB: 11-05-1945 Today's Date: 12/13/2020    History of Present Illness Pt is a 75 y.o. female who presented 6/14 with a history of pain and functional disability in the right knee due to arthritis and has failed non-surgical conservative treatments for greater than 12 weeks to includeNSAID's and/or analgesics, corticosteriod injections, viscosupplementation injections, flexibility and strengthening excercises, use of assistive devices, and activity modification. S/p R TKA 6/14. PMH: gout, HTN, s/p R THA 2020, and OA.    PT Comments    Focused session on stair negotiation, with pt needing +2 modA bil HHA to navigate 2 stairs this date. Provided bil HHA to simulate home as she will be discharging home with her 2 daughters with STE that do not have rails. Educated pt and her daughter on sequencing of feet with stairs and proper, safe guarding of pt on stairs, with them verbalizing understanding and that they will be able to provide the level of assistance needed for this task. Pt's daughter assisted  therapist this session to ensure comprehension of form and level of assistance needed. Pt fatigued after negotiating the stairs and thus only ambulated ~5 ft with the RW and min guard assist. Pt would benefit from follow-up HHPT and HHOT at d/c. Will continue to follow acutely.   Follow Up Recommendations  Home health PT;Supervision for mobility/OOB (and HHOT)     Equipment Recommendations  Rolling walker with 5" wheels;3in1 (PT)    Recommendations for Other Services OT consult     Precautions / Restrictions Precautions Precautions: Fall;Knee Precaution Booklet Issued: Yes (comment) Precaution Comments: HOH Restrictions Weight Bearing Restrictions: Yes RLE Weight Bearing: Weight bearing as tolerated    Mobility  Bed Mobility               General bed mobility comments: Pt sitting up in recliner upon  arrival.    Transfers Overall transfer level: Needs assistance Equipment used: Rolling walker (2 wheeled) Transfers: Sit to/from Stand Sit to Stand: Min assist         General transfer comment: MinA to power up and steady to stand from recliner 1x, cuing to push up from recliner and walk bil feet posteriorly as she stood up to improve her upright posture.  Ambulation/Gait Ambulation/Gait assistance: Min guard Gait Distance (Feet): 5 Feet Assistive device: Rolling walker (2 wheeled) Gait Pattern/deviations: Step-through pattern;Decreased stance time - right;Antalgic;Decreased stride length;Trunk flexed Gait velocity: reduced Gait velocity interpretation: <1.31 ft/sec, indicative of household ambulator General Gait Details: Ambulated after practicing stairs with pt fatiguing quickly. Pt with trunk flexed posture, ambulating slowly. Pt with R antalgic gait pattern. No LOB, min guard for safety.   Stairs Stairs: Yes Stairs assistance: Mod assist;+2 physical assistance Stair Management: No rails;Step to pattern;Forwards (with bil HHA) Number of Stairs: 2 General stair comments: Ascends with daughter on one side providing HHA and PT on other with bil HHA, modAx2, cuing pt to lead up with L leg and lean anteriorly to step up. Descends with bil hands on PT's shoulders facing anteriorly to her, cuing to lead down with R leg. Increased time and unsteadiness due to apparent fear of falling, repeatedly asking "you got me?". educated pt and her daughter on safe stair negotiation and guarding with +2 at home.   Wheelchair Mobility    Modified Rankin (Stroke Patients Only)       Balance Overall balance assessment: Needs assistance  Standing balance support: Bilateral upper extremity supported;During functional activity Standing balance-Leahy Scale: Poor Standing balance comment: Reliant on bil UE support for mobility.                            Cognition  Arousal/Alertness: Awake/alert Behavior During Therapy: WFL for tasks assessed/performed Overall Cognitive Status: Within Functional Limits for tasks assessed                                        Exercises      General Comments        Pertinent Vitals/Pain Pain Assessment: Faces Faces Pain Scale: Hurts little more Pain Location: R hip/knee Pain Descriptors / Indicators: Discomfort;Aching;Grimacing;Guarding Pain Intervention(s): Limited activity within patient's tolerance;Monitored during session;Repositioned    Home Living                      Prior Function            PT Goals (current goals can now be found in the care plan section) Acute Rehab PT Goals Patient Stated Goal: to not fall PT Goal Formulation: With patient/family Time For Goal Achievement: 12/27/20 Potential to Achieve Goals: Good Progress towards PT goals: Progressing toward goals    Frequency    7X/week      PT Plan Current plan remains appropriate    Co-evaluation              AM-PAC PT "6 Clicks" Mobility   Outcome Measure  Help needed turning from your back to your side while in a flat bed without using bedrails?: None Help needed moving from lying on your back to sitting on the side of a flat bed without using bedrails?: None Help needed moving to and from a bed to a chair (including a wheelchair)?: A Little Help needed standing up from a chair using your arms (e.g., wheelchair or bedside chair)?: A Little Help needed to walk in hospital room?: A Little Help needed climbing 3-5 steps with a railing? : A Lot 6 Click Score: 19    End of Session Equipment Utilized During Treatment: Gait belt Activity Tolerance: Patient tolerated treatment well Patient left: in chair;with call bell/phone within reach;with chair alarm set;with family/visitor present Nurse Communication: Mobility status PT Visit Diagnosis: Unsteadiness on feet (R26.81);Other abnormalities of  gait and mobility (R26.89);Difficulty in walking, not elsewhere classified (R26.2);Pain;Muscle weakness (generalized) (M62.81) Pain - Right/Left: Right Pain - part of body: Knee     Time: 1212-1234 PT Time Calculation (min) (ACUTE ONLY): 22 min  Charges:  $Gait Training: 8-22 mins                     Jane Vasquez, PT, DPT Acute Rehabilitation Services  Pager: [phone] Office: [phone]    Jane Vasquez 12/13/2020, 1:35 PM

## 2020-12-20 ENCOUNTER — Other Ambulatory Visit: Payer: Self-pay | Admitting: Orthopaedic Surgery

## 2020-12-20 ENCOUNTER — Telehealth: Payer: Self-pay

## 2020-12-20 MED ORDER — OXYCODONE HCL 5 MG PO TABS: 5.0000 mg | ORAL_TABLET | ORAL | 0 refills | Status: DC | PRN

## 2020-12-20 NOTE — Telephone Encounter (Signed)
Pt's daughter called again to see if oxycodone prescription had been filled for her mother. Daughter stated the best pharmacy is [facility] ([address] [facility] [address] [facility], phone number [phone]). The best call back number is [phone] if there are any questions.

## 2020-12-20 NOTE — Telephone Encounter (Signed)
Pt called and would like a refill on oxycodone 5 mg

## 2020-12-21 ENCOUNTER — Telehealth: Payer: Self-pay

## 2020-12-21 ENCOUNTER — Telehealth: Payer: Self-pay | Admitting: Orthopaedic Surgery

## 2020-12-21 NOTE — Telephone Encounter (Signed)
FYI patient received a refill June 10.

## 2020-12-21 NOTE — Telephone Encounter (Signed)
Waiting communication with Dr. Magnus Ivan

## 2020-12-21 NOTE — Telephone Encounter (Signed)
[facility] on [address] called regarding rx for oxycodone, pharmacist stated the patient has went "pharmacy shopping" he stated in their records its showing the patient received a 90 day supply of narcan from Dr.Knight in [place] ,[facility] [address], the pharmacist stated the rx will not be refilled until he hears back from Dr.Blackman call back:[phone]

## 2020-12-21 NOTE — Telephone Encounter (Signed)
Received call from patient's daughter Gabriel Rung stating the pharmacy need prior authorization in order to fill Rx (Oxycodone) Monique said patient took her last pill around 2:00am this morning. Monique asked if she can be called when she can pick up the medication this morning. The number to contact Gabriel Rung is [phone]

## 2020-12-21 NOTE — Telephone Encounter (Signed)
Patient daughter aware that she CAN NOT take both Hydrocodone and Oxycodone

## 2020-12-21 NOTE — Telephone Encounter (Signed)
Monique called checking on the autho. I did let her know Dr. Magnus Ivan has surgery this morning but as soon as we can get in touch we'll get her a CB. Monique asked if there's any other Dr. that can approve it for Dr. Magnus Ivan? She states the pt is completely out of her rx and is in pain. Gabriel Rung would really like to speak with Morrie Sheldon as soon as she has a moment.   [phone]

## 2020-12-25 ENCOUNTER — Encounter: Payer: Self-pay | Admitting: Orthopaedic Surgery

## 2020-12-25 ENCOUNTER — Ambulatory Visit (INDEPENDENT_AMBULATORY_CARE_PROVIDER_SITE_OTHER): Payer: Medicare Other | Admitting: Orthopaedic Surgery

## 2020-12-25 ENCOUNTER — Other Ambulatory Visit: Payer: Self-pay

## 2020-12-25 DIAGNOSIS — Z96651 Presence of right artificial knee joint: Secondary | ICD-10-CM

## 2020-12-25 NOTE — Progress Notes (Signed)
The patient is 2 weeks tomorrow status post a right total knee arthroplasty.  Her daughters are with her today.  They state that she has been doing well with her home therapy.  Notes with her from the therapist also said that she has been doing well.  She is ready to transition outpatient physical therapy.  She has been compliant with taking aspirin and wearing compressive hose.  Her right knee incision looks good.  Remove the staples in place Steri-Strips.  She does have significant swelling to be expected with her knee but her foot and ankle swelling is gone down significantly.  Her calf is soft.  She has almost full extension to about 85 degrees flexion.  They understand the importance for outpatient physical therapy.  We will see if we get that set up soon even here at our office.  We will be in touch about that.  She will continue her home exercises multiple times a day until then.  I would like to see her back in 4 weeks to see how her progress is with her motion.  All questions and concerns were answered and addressed.

## 2020-12-27 ENCOUNTER — Ambulatory Visit (INDEPENDENT_AMBULATORY_CARE_PROVIDER_SITE_OTHER): Payer: Medicare Other | Admitting: Physical Therapy

## 2020-12-27 ENCOUNTER — Encounter: Payer: Self-pay | Admitting: Physical Therapy

## 2020-12-27 ENCOUNTER — Other Ambulatory Visit: Payer: Self-pay

## 2020-12-27 DIAGNOSIS — M25561 Pain in right knee: Secondary | ICD-10-CM

## 2020-12-27 DIAGNOSIS — M25661 Stiffness of right knee, not elsewhere classified: Secondary | ICD-10-CM | POA: Diagnosis not present

## 2020-12-27 DIAGNOSIS — R600 Localized edema: Secondary | ICD-10-CM

## 2020-12-27 DIAGNOSIS — R262 Difficulty in walking, not elsewhere classified: Secondary | ICD-10-CM

## 2020-12-27 DIAGNOSIS — M6281 Muscle weakness (generalized): Secondary | ICD-10-CM | POA: Diagnosis not present

## 2020-12-27 NOTE — Therapy (Signed)
[facility] [address] [place], [place], [id number] Phone: [phone]   Fax:  [phone]  Physical Therapy Evaluation  Patient Details  Name: Jane Vasquez MRN: [id number] Date of Birth: 1946/02/14 Referring Provider (PT): Gordy SaversBalckman, Christopher MD  Encounter Date: 12/27/2020   PT End of Session - 12/27/20 1214     Visit Number 1    Number of Visits 24    Date for PT Re-Evaluation 03/23/21    Authorization Type Medicare    Progress Note Due on Visit 10    PT Start Time 1112   pt arrived 9 minutes late to appointment   PT Stop Time 1200    PT Time Calculation (min) 48 min    Activity Tolerance Patient tolerated treatment well    Behavior During [facility] for tasks assessed/performed             Past Medical History:  Diagnosis Date   Gout    Gout    HTN (hypertension)    Osteoarthritis    left knee , right hip     Past Surgical History:  Procedure Laterality Date   APPENDECTOMY  age 89   JOINT REPLACEMENT     TOTAL HIP ARTHROPLASTY Right 04/23/2019   Procedure: RIGHT TOTAL HIP ARTHROPLASTY ANTERIOR APPROACH;  Surgeon: Kathryne HitchBlackman, Christopher Y, MD;  Location: [facility];  Service: Orthopedics;  Laterality: Right;   TOTAL KNEE ARTHROPLASTY Right 12/12/2020   Procedure: RIGHT TOTAL KNEE ARTHROPLASTY;  Surgeon: Kathryne HitchBlackman, Christopher Y, MD;  Location: [facility] OR;  Service: Orthopedics;  Laterality: Right;    There were no vitals filed for this visit.    Subjective Assessment - 12/27/20 1133     Subjective Pt arriving to therapy s/p right TKA 12/12/2020. Pt reporting 4/10 pain in right knee. Pt stating that she received 6 HHPT visits.    Pertinent History gout, HTN, s/p R THA 2020, and OA, right THA October 2020.    Diagnostic tests X-ray    Patient Stated Goals To be able to walk with my knee straight and get my mobility back    Currently in Pain? Yes    Pain Score 4                 [facility] PT Assessment - 12/27/20 0001        Assessment   Medical Diagnosis Z96.651 s/p rigtht TKA    Referring Provider (PT) Gordy SaversBalckman, Christopher MD    Onset Date/Surgical Date 12/12/20    Hand Dominance Right    Prior Therapy HHPT 6 visits and PT following pt's right THA in 2020      Precautions   Precautions None      Restrictions   Weight Bearing Restrictions No      Balance Screen   Has the patient fallen in the past 6 months No    Is the patient reluctant to leave their home because of a fear of falling?  No      Home Environment   Living Environment Private residence    Type of Home House    Home Access Stairs to enter    Entrance Stairs-Number of Steps 2 for garage and 3 for front with no rails, front steps are wider and easier fot pt to navigate per daughter with someones assistance      Prior Function   Level of Independence Independent    Vocation Retired      IT consultantCognition   Overall Cognitive Status Within Functional Limits  for tasks assessed      Observation/Other Assessments   Focus on Therapeutic Outcomes (FOTO)  perform next visit, pt arrived late and it wasn't caputured when checking out      Observation/Other Assessments-Edema    Edema Circumferential      Circumferential Edema   Circumferential - Right 48.5 centimeters    Circumferential - Left  43 centimeter      Posture/Postural Control   Posture/Postural Control Postural limitations    Postural Limitations Rounded Shoulders;Forward head      ROM / Strength   AROM / PROM / Strength AROM;Strength;PROM      AROM   Overall AROM  Deficits    Overall AROM Comments measured in supine    AROM Assessment Site Knee    Right/Left Knee Right;Left    Right Knee Extension 5    Right Knee Flexion 70    Left Knee Extension 0    Left Knee Flexion 110      PROM   Overall PROM  Deficits    Overall PROM Comments measured in supine    PROM Assessment Site Knee    Right/Left Knee Right    Right Knee Extension 2    Right Knee Flexion 78    Left Knee  Extension 0    Left Knee Flexion 112      Strength   Overall Strength Deficits    Strength Assessment Site Knee;Hip    Right/Left Hip Right;Left    Right Hip Flexion 4-/5    Right Hip ABduction 4-/5    Right Hip ADduction 4-/5    Left Hip Flexion 4/5    Left Hip ABduction 4/5    Left Hip ADduction 4/5    Right/Left Knee Right;Left    Right Knee Flexion 3/5    Right Knee Extension 3/5    Left Knee Flexion 4-/5    Left Knee Extension 4-/5      Palpation   Palpation comment TTP: medial and lateral joint line of right knee      Ambulation/Gait   Assistive device Rolling walker    Gait Pattern Step-through pattern;Decreased hip/knee flexion - left;Decreased hip/knee flexion - right;Decreased dorsiflexion - right;Decreased dorsiflexion - left;Decreased stance time - right;Decreased step length - right;Decreased step length - left    Ambulation Surface Level                        Objective measurements completed on examination: See above findings.               PT Education - 12/27/20 1202     Education Details PT POC, HEP    Person(s) Educated Patient    Methods Explanation;Demonstration              PT Short Term Goals - 12/27/20 1218       PT SHORT TERM GOAL #1   Title Pt will be independent in her initial HEP.    Time 3    Period Weeks    Status New    Target Date 01/19/21      PT SHORT TERM GOAL #2   Title Pt will be able to amb > 150 feet on level surfaces with straigt cane safely.    Baseline amb with rolling walker    Time 4    Period Weeks    Status New    Target Date 01/26/21      PT SHORT TERM GOAL #3  Title Perform FOTO next visit and update predicted value in LTG.    Time 1    Period Weeks    Status New    Target Date 01/05/21               PT Long Term Goals - 12/27/20 1237       PT LONG TERM GOAL #1   Title Pt will be independent in advanced HEP.    Time 12    Period Weeks    Status New      PT  LONG TERM GOAL #2   Title Pt will be able to perform 5 times sit to stand in </= 14 seconds using UE support    Baseline 54 seconds with UE support    Time 12    Period Weeks    Status New      PT LONG TERM GOAL #3   Title Pt will improve her right knee AROM to >/= 120 degrees to improve functional mobility.    Baseline AROM 70 degrees on 12/27/2020    Time 12    Period Weeks    Status New      PT LONG TERM GOAL #4   Title Pt will be able to amb with no device on community level surfaces safely for >/= 500 feet.    Baseline amb short distances with rollling walker    Time 12    Period Weeks    Status New      PT LONG TERM GOAL #5   Title Pt will improve her right knee strength to >/= 4+/5.    Baseline see flowsheets    Time 12    Period Weeks    Status New      PT LONG TERM GOAL #6   Title pt will improve her FOTO score to predicted outcome.    Time 12    Period Weeks    Status New                    Plan - 12/27/20 1204     Clinical Impression Statement Pt is a 75 y.o. female who presented 6/14 with a history of pain and functional disability in the right knee due to arthritis and has failed non-surgical conservative treatments for greater than 12 weeks to includeNSAID's and/or analgesics, corticosteriod injections, viscosupplementation injections, flexibility and strengthening excercises, use of assistive devices, and activity modification. Pt s/p right TKA on 6/14/202. Pt amb into clinic using a rolling walker. Pt's daughter present during evaluation. Pt has received 6 HHPT visits prior to outpt evaluation. Pt reporting 4/10 pain in right knee today. Pt with increased swelling in right knee compared to left by 5.5 centimeters. Pt requiring increased time for amb and sit to stand using UE support. Pt with weakness noted in bilateral hips and knees. Pt's daughter reported that pt will eventually have to have a left knee replacement per MD's assessment.  Pt was edu in  HEP and gait training with heel to toe pattern using her rolling walker. Pt would benefit from skilled PT services to progress toward her PLOF and to maximize function and safety using the below interventions.    Personal Factors and Comorbidities Comorbidity 3+    Comorbidities S/p Right TKA  on 6/14.         PMH: gout, HTN, s/p R THA 2020, and OA.    Examination-Activity Limitations Bathing;Dressing;Squat;Stairs;Stand;Other;Sit;Sleep    Examination-Participation Restrictions Community Activity;Other;Church;Driving  Stability/Clinical Decision Making Stable/Uncomplicated    Clinical Decision Making Low    Rehab Potential Good    PT Frequency 2x / week    PT Duration 12 weeks    PT Treatment/Interventions ADLs/Self Care Home Management;Cryotherapy;Electrical Stimulation;DME Instruction;Gait training;Stair training;Functional mobility training;Therapeutic activities;Therapeutic exercise;Balance training;Patient/family education;Neuromuscular re-education;Manual techniques;Passive range of motion;Taping    PT Next Visit Plan Nustep, LE strengthening bilaterally, right knee ROM, sit to stand, gait training heel to toe gait    PT Home Exercise Plan Access Code: [email or web address]  URL: [email or web address]  Date: 12/27/2020  Prepared by: Narda Amber    Exercises  Supine Heel Slide with Strap - 3 x daily - 7 x weekly - 2 sets - 10 reps - 10 seconds hold  Seated Knee Flexion Extension AROM - 3 x daily - 7 x weekly - 2 sets - 10 reps  Supine Short Arc Quad - 3 x daily - 7 x weekly - 2 sets - 10 reps  Seated Long Arc Quad - 3 x daily - 7 x weekly - 2 sets - 10 reps - 3-5 seconds hold  Seated Hamstring Stretch - 3 x daily - 7 x weekly - 5 reps - 30 seconds hold  Supine Active Straight Leg Raise - 3 x daily - 7 x weekly - 2 sets - 10 reps    Consulted and Agree with Plan of Care Patient             Patient will benefit from skilled therapeutic intervention in order to improve the following  deficits and impairments:  Pain, Difficulty walking, Decreased range of motion, Decreased activity tolerance, Decreased balance, Impaired flexibility, Decreased mobility, Decreased strength, Increased edema  Visit Diagnosis: Muscle weakness (generalized)  Stiffness of right knee, not elsewhere classified  Acute pain of right knee  Localized edema  Difficulty in walking, not elsewhere classified     Problem List Patient Active Problem List   Diagnosis Date Noted   Arthritis of right knee 12/12/2020   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 [place], [place], [address] Phone: [phone]   Fax:  [phone]  Name: Jane Vasquez MRN: [id number] Date of Birth: 1945/12/13

## 2020-12-27 NOTE — Patient Instructions (Signed)
Access Code: [email or web address] URL: [email or web address] Date: 12/27/2020 Prepared by: Narda Amber  Exercises Supine Heel Slide with Strap - 3 x daily - 7 x weekly - 2 sets - 10 reps - 10 seconds hold Seated Knee Flexion Extension AROM - 3 x daily - 7 x weekly - 2 sets - 10 reps Supine Short Arc Quad - 3 x daily - 7 x weekly - 2 sets - 10 reps Seated Long Arc Quad - 3 x daily - 7 x weekly - 2 sets - 10 reps - 3-5 seconds hold Seated Hamstring Stretch - 3 x daily - 7 x weekly - 5 reps - 30 seconds hold Supine Active Straight Leg Raise - 3 x daily - 7 x weekly - 2 sets - 10 reps

## 2020-12-28 ENCOUNTER — Encounter: Payer: Self-pay | Admitting: Rehabilitative and Restorative Service Providers"

## 2020-12-28 ENCOUNTER — Ambulatory Visit (INDEPENDENT_AMBULATORY_CARE_PROVIDER_SITE_OTHER): Payer: Medicare Other | Admitting: Rehabilitative and Restorative Service Providers"

## 2020-12-28 DIAGNOSIS — M25661 Stiffness of right knee, not elsewhere classified: Secondary | ICD-10-CM

## 2020-12-28 DIAGNOSIS — R600 Localized edema: Secondary | ICD-10-CM | POA: Diagnosis not present

## 2020-12-28 DIAGNOSIS — M25561 Pain in right knee: Secondary | ICD-10-CM

## 2020-12-28 DIAGNOSIS — M6281 Muscle weakness (generalized): Secondary | ICD-10-CM | POA: Diagnosis not present

## 2020-12-28 DIAGNOSIS — R262 Difficulty in walking, not elsewhere classified: Secondary | ICD-10-CM

## 2020-12-28 NOTE — Patient Instructions (Signed)
Added quadriceps sets and 2 knee flexion AROM options to Jane Vasquez's HEP

## 2020-12-28 NOTE — Therapy (Signed)
[facility] Physical Therapy [address] [place], [place], [address] Phone: [phone]   Fax:  [phone]  Physical Therapy Treatment  Patient Details  Name: Jane Vasquez MRN: [id number] Date of Birth: 12-Aug-1945 Referring Provider (PT): Gordy Savers MD   Encounter Date: 12/28/2020   PT End of Session - 12/28/20 1621     Visit Number 2    Number of Visits 24    Date for PT Re-Evaluation 03/23/21    Authorization Type Medicare    Progress Note Due on Visit 10    PT Start Time 1307    PT Stop Time 1354    PT Time Calculation (min) 47 min    Activity Tolerance Patient tolerated treatment well;Patient limited by pain    Behavior During Therapy [facility] for tasks assessed/performed             Past Medical History:  Diagnosis Date   Gout    Gout    HTN (hypertension)    Osteoarthritis    left knee , right hip     Past Surgical History:  Procedure Laterality Date   APPENDECTOMY  age 64   JOINT REPLACEMENT     TOTAL HIP ARTHROPLASTY Right 04/23/2019   Procedure: RIGHT TOTAL HIP ARTHROPLASTY ANTERIOR APPROACH;  Surgeon: Jane Hitch, MD;  Location: [facility];  Service: Orthopedics;  Laterality: Right;   TOTAL KNEE ARTHROPLASTY Right 12/12/2020   Procedure: RIGHT TOTAL KNEE ARTHROPLASTY;  Surgeon: Jane Hitch, MD;  Location: [facility];  Service: Orthopedics;  Laterality: Right;    There were no vitals filed for this visit.   Subjective Assessment - 12/28/20 1613     Subjective Jane Vasquez reports compliance with her day 1 HEP.    Patient is accompained by: Family member   Husband   Pertinent History gout, HTN, s/p R THA 2020, and OA, right THA October 2020.    Limitations House hold activities;Lifting;Standing;Walking    Diagnostic tests X-ray    Patient Stated Goals To be able to walk with my knee straight and get my mobility back    Currently in Pain? Yes    Pain Score 4     Pain Location Knee    Pain Orientation Right    Pain  Descriptors / Indicators Aching;Tightness    Pain Type Surgical pain;Chronic pain    Pain Onset 1 to 4 weeks ago    Pain Frequency Constant    Aggravating Factors  Prolonged postures and too much WB    Pain Relieving Factors Movement, ice, pain medications    Effect of Pain on Daily Activities Using a walker, limited participation in normal WB ADLs    Multiple Pain Sites No                               [facility] Adult PT Treatment/Exercise - 12/28/20 0001       Exercises   Exercises Knee/Hip      Knee/Hip Exercises: Aerobic   Stationary Bike Seat 7 8 minutes AROM only    Nustep Next visit      Knee/Hip Exercises: Machines for Strengthening   Total Gym Leg Press 50# full extension to as much flexion as possible slow eccentrics 15X      Knee/Hip Exercises: Seated   Other Seated Knee/Hip Exercises Tailgate knee flexion    Other Seated Knee/Hip Exercises AAROM (L pushes R into flexion) 10X 10 seconds  Knee/Hip Exercises: Supine   Quad Sets Strengthening;Both;2 sets;10 reps;Limitations    Quad Sets Limitations 5 seconds (toes back, press knees down, tighten thighs)    Heel Slides AAROM;Right;1 set;10 reps;Limitations    Heel Slides Limitations 10 seconds                    PT Education - 12/28/20 1617     Education Details Reviewed HEP, started bike for AROM, leg press for strength, full extension AROM and stretch overpressure into flexion    Person(s) Educated Patient;Spouse    Methods Explanation;Demonstration;Tactile cues;Verbal cues;Handout    Comprehension Verbal cues required;Returned demonstration;Need further instruction;Tactile cues required;Verbalized understanding              PT Short Term Goals - 12/28/20 1617       PT SHORT TERM GOAL #1   Title Pt will be independent in her initial HEP.    Time 3    Period Weeks    Status On-going    Target Date 01/19/21      PT SHORT TERM GOAL #2   Title Pt will be able to amb > 150  feet on level surfaces with straight cane safely.    Baseline amb with rolling walker    Time 4    Period Weeks    Status On-going    Target Date 01/26/21      PT SHORT TERM GOAL #3   Title Perform FOTO next visit and update predicted value in LTG.    Baseline 51 (Goal 65)    Time 1    Period Weeks    Status New    Target Date 01/05/21               PT Long Term Goals - 12/28/20 1620       PT LONG TERM GOAL #1   Title Pt will be independent in advanced HEP.    Time 12    Period Weeks    Status On-going      PT LONG TERM GOAL #2   Title Pt will be able to perform 5 times sit to stand in </= 14 seconds using UE support    Baseline 54 seconds with UE support    Time 12    Period Weeks    Status On-going      PT LONG TERM GOAL #3   Title Pt will improve her right knee AROM to >/= 120 degrees to improve functional mobility.    Baseline AROM 70 degrees on 12/27/2020    Time 12    Period Weeks    Status On-going      PT LONG TERM GOAL #4   Title Pt will be able to amb with no device on community level surfaces safely for >/= 500 feet.    Baseline amb short distances with rollling walker    Time 12    Period Weeks    Status On-going      PT LONG TERM GOAL #5   Title Pt will improve her right knee strength to >/= 4+/5.    Baseline see flowsheets    Time 12    Period Weeks    Status On-going      PT LONG TERM GOAL #6   Title pt will improve her FOTO score to predicted outcome.    Baseline 51 (Goal 65)    Time 12    Period Weeks    Status On-going  Plan - 12/28/20 1622     Clinical Impression Statement Jane Vasquez reports early compliance with her starter HEP.  We discussed this along with adding 100 quadriceps sets/day and 2 optional knee flexion AROM activities to complement or substitute for her current knee flexion AROM activities.  We reviewed the importance of multiple time/day HEP compliance and emphasis on AROM (extension and  flexion) and quadriceps strength to control edema.  Continue POC.    Personal Factors and Comorbidities Comorbidity 3+    Comorbidities S/p Right TKA  on 6/14.         PMH: gout, HTN, s/p R THA 2020, and OA.    Examination-Activity Limitations Bathing;Dressing;Squat;Stairs;Stand;Other;Sit;Sleep    Examination-Participation Restrictions Community Activity;Other;Church;Driving    Stability/Clinical Decision Making Stable/Uncomplicated    Rehab Potential Good    PT Frequency 2x / week    PT Duration 12 weeks    PT Treatment/Interventions ADLs/Self Care Home Management;Cryotherapy;Electrical Stimulation;DME Instruction;Gait training;Stair training;Functional mobility training;Therapeutic activities;Therapeutic exercise;Balance training;Patient/family education;Neuromuscular re-education;Manual techniques;Passive range of motion;Taping    PT Next Visit Plan Nustep over bike, LE strengthening bilaterally, right knee ROM (flexion emphasis), sit to stand, gait training heel to toe gait    PT Home Exercise Plan Access Code: [email or web address]  URL: [email or web address]  Date: 12/27/2020  Prepared by: Narda Amber    Exercises  Supine Heel Slide with Strap - 3 x daily - 7 x weekly - 2 sets - 10 reps - 10 seconds hold  Seated Knee Flexion Extension AROM - 3 x daily - 7 x weekly - 2 sets - 10 reps  Supine Short Arc Quad - 3 x daily - 7 x weekly - 2 sets - 10 reps  Seated Long Arc Quad - 3 x daily - 7 x weekly - 2 sets - 10 reps - 3-5 seconds hold  Seated Hamstring Stretch - 3 x daily - 7 x weekly - 5 reps - 30 seconds hold  Supine Active Straight Leg Raise - 3 x daily - 7 x weekly - 2 sets - 10 reps    Consulted and Agree with Plan of Care Patient             Patient will benefit from skilled therapeutic intervention in order to improve the following deficits and impairments:  Pain, Difficulty walking, Decreased range of motion, Decreased activity tolerance, Decreased balance, Impaired flexibility,  Decreased mobility, Decreased strength, Increased edema  Visit Diagnosis: Muscle weakness (generalized)  Difficulty in walking, not elsewhere classified  Localized edema  Stiffness of right knee, not elsewhere classified  Acute pain of right knee     Problem List Patient Active Problem List   Diagnosis Date Noted   Arthritis of right knee 12/12/2020   Status post total right knee replacement 12/12/2020   Degenerative arthritis of knee, bilateral 06/07/2020   Status post total replacement of right hip 04/23/2019   Unilateral primary osteoarthritis, right hip 04/22/2019   Unilateral primary osteoarthritis, left hip 06/25/2018    Cherlyn Cushing PT, MPT 12/28/2020, 4:25 PM  [facility] Physical Therapy [address] [place], [place], [address] Phone: [phone]   Fax:  [phone]  Name: Jane Vasquez MRN: [id number] Date of Birth: [birth year]

## 2021-01-02 ENCOUNTER — Ambulatory Visit (INDEPENDENT_AMBULATORY_CARE_PROVIDER_SITE_OTHER): Payer: Medicare Other | Admitting: Physical Therapy

## 2021-01-02 ENCOUNTER — Encounter: Payer: Self-pay | Admitting: Physical Therapy

## 2021-01-02 ENCOUNTER — Other Ambulatory Visit: Payer: Self-pay

## 2021-01-02 DIAGNOSIS — M25661 Stiffness of right knee, not elsewhere classified: Secondary | ICD-10-CM

## 2021-01-02 DIAGNOSIS — M6281 Muscle weakness (generalized): Secondary | ICD-10-CM | POA: Diagnosis not present

## 2021-01-02 DIAGNOSIS — R262 Difficulty in walking, not elsewhere classified: Secondary | ICD-10-CM | POA: Diagnosis not present

## 2021-01-02 DIAGNOSIS — R600 Localized edema: Secondary | ICD-10-CM

## 2021-01-02 NOTE — Therapy (Signed)
[facility] Physical Therapy [address] [place], [place], [address] Phone: [phone]   Fax:  [phone]  Physical Therapy Treatment  Patient Details  Name: Jane Vasquez MRN: [id number] Date of Birth: 1946-03-25 Referring Provider (PT): Gordy Savers MD   Encounter Date: 01/02/2021   PT End of Session - 01/02/21 1158     Visit Number 3    Number of Visits 24    Date for PT Re-Evaluation 03/23/21    Authorization Type Medicare    Progress Note Due on Visit 10    PT Start Time 1147    PT Stop Time 1232    PT Time Calculation (min) 45 min    Equipment Utilized During Treatment Gait belt    Activity Tolerance Patient tolerated treatment well;Patient limited by pain    Behavior During Therapy WFL for tasks assessed/performed             Past Medical History:  Diagnosis Date   Gout    Gout    HTN (hypertension)    Osteoarthritis    left knee , right hip     Past Surgical History:  Procedure Laterality Date   APPENDECTOMY  age 75   JOINT REPLACEMENT     TOTAL HIP ARTHROPLASTY Right 04/23/2019   Procedure: RIGHT TOTAL HIP ARTHROPLASTY ANTERIOR APPROACH;  Surgeon: Kathryne Hitch, MD;  Location: [facility];  Service: Orthopedics;  Laterality: Right;   TOTAL KNEE ARTHROPLASTY Right 12/12/2020   Procedure: RIGHT TOTAL KNEE ARTHROPLASTY;  Surgeon: Kathryne Hitch, MD;  Location: [facility];  Service: Orthopedics;  Laterality: Right;    There were no vitals filed for this visit.   Subjective Assessment - 01/02/21 1154     Subjective Pt arriving today with her husband. Pt reporting 3/10 pain in right knee. Pt reporting she has been placing ice 3x/day.    Pertinent History gout, HTN, s/p R THA 2020, and OA, right THA October 2020.    Limitations House hold activities;Lifting;Standing;Walking    Diagnostic tests X-ray    Patient Stated Goals To be able to walk with my knee straight and get my mobility back    Currently in Pain? Yes     Pain Score 3     Pain Location Knee    Pain Orientation Right    Pain Descriptors / Indicators Aching;Sore;Tightness    Pain Type Surgical pain    Pain Onset 1 to 4 weeks ago    Pain Frequency Constant                [facility] PT Assessment - 01/02/21 0001       Assessment   Medical Diagnosis Z96.651 s/p rigtht TKA    Referring Provider (PT) Gordy Savers MD    Onset Date/Surgical Date 12/12/20    Hand Dominance Right      Observation/Other Assessments   Focus on Therapeutic Outcomes (FOTO)  53 (predicted 65)      AROM   Right/Left Knee Right    Right Knee Extension 5    Right Knee Flexion 75      PROM   Right/Left Knee Right    Right Knee Extension 0    Right Knee Flexion 82                           [facility] Adult PT Treatment/Exercise - 01/02/21 0001       Knee/Hip Exercises: Aerobic   Nustep L5 x 7  minutes      Knee/Hip Exercises: Seated   Long Arc Quad Strengthening;Right;2 sets;10 reps    Long Arc Quad Weight 2 lbs.    Other Seated Knee/Hip Exercises Tailgate knee flexion    Other Seated Knee/Hip Exercises AAROM (L pushes R into flexion) 10X 10 seconds      Knee/Hip Exercises: Supine   Heel Slides AAROM;Right;1 set;10 reps;Limitations    Heel Slides Limitations 3-5 seconds    Bridges Strengthening;Both;10 reps    Straight Leg Raises Strengthening;Right;10 reps      Modalities   Modalities Vasopneumatic      Vasopneumatic   Number Minutes Vasopneumatic  10 minutes    Vasopnuematic Location  Knee    Vasopneumatic Pressure Medium    Vasopneumatic Temperature  34      Manual Therapy   Manual Therapy Passive ROM    Passive ROM right knee flexion and extension with overpressure                      PT Short Term Goals - 01/02/21 1225       PT SHORT TERM GOAL #1   Title Pt will be independent in her initial HEP.    Status On-going      PT SHORT TERM GOAL #2   Title Pt will be able to amb > 150 feet on level  surfaces with straight cane safely.    Status On-going      PT SHORT TERM GOAL #3   Title Perform FOTO next visit and update predicted value in LTG.    Status On-going               PT Long Term Goals - 12/28/20 1620       PT LONG TERM GOAL #1   Title Pt will be independent in advanced HEP.    Time 12    Period Weeks    Status On-going      PT LONG TERM GOAL #2   Title Pt will be able to perform 5 times sit to stand in </= 14 seconds using UE support    Baseline 54 seconds with UE support    Time 12    Period Weeks    Status On-going      PT LONG TERM GOAL #3   Title Pt will improve her right knee AROM to >/= 120 degrees to improve functional mobility.    Baseline AROM 70 degrees on 12/27/2020    Time 12    Period Weeks    Status On-going      PT LONG TERM GOAL #4   Title Pt will be able to amb with no device on community level surfaces safely for >/= 500 feet.    Baseline amb short distances with rollling walker    Time 12    Period Weeks    Status On-going      PT LONG TERM GOAL #5   Title Pt will improve her right knee strength to >/= 4+/5.    Baseline see flowsheets    Time 12    Period Weeks    Status On-going      PT LONG TERM GOAL #6   Title pt will improve her FOTO score to predicted outcome.    Baseline 51 (Goal 65)    Time 12    Period Weeks    Status On-going  Plan - 01/02/21 1201     Clinical Impression Statement Pt arriving today reporting 3/10 right knee pain. Pt reporting doing as much as possible with her 100 quad sets daily. Pt reporting compliance with all her other home exercises. Pt also reporting icing 3 x each day. Pt tolerating Nustep today. Progressing with right knee flexion and extension ROM and quadriceps strengthening. Continue skilled PT to maximize function.    Personal Factors and Comorbidities Comorbidity 3+    Comorbidities S/p Right TKA  on 6/14.         PMH: gout, HTN, s/p R THA 2020, and OA.     Examination-Activity Limitations Bathing;Dressing;Squat;Stairs;Stand;Other;Sit;Sleep    Examination-Participation Restrictions Community Activity;Other;Church;Driving    Stability/Clinical Decision Making Stable/Uncomplicated    PT Frequency 2x / week    PT Duration 12 weeks    PT Treatment/Interventions ADLs/Self Care Home Management;Cryotherapy;Electrical Stimulation;DME Instruction;Gait training;Stair training;Functional mobility training;Therapeutic activities;Therapeutic exercise;Balance training;Patient/family education;Neuromuscular re-education;Manual techniques;Passive range of motion;Taping    PT Next Visit Plan Nustep,  LE strengthening bilaterally, right knee ROM (flexion emphasis), sit to stand, gait training heel to toe gait    PT Home Exercise Plan Access Code: [email or web address]  URL: [email or web address]  Date: 12/27/2020  Prepared by: Narda Amber    Exercises  Supine Heel Slide with Strap - 3 x daily - 7 x weekly - 2 sets - 10 reps - 10 seconds hold  Seated Knee Flexion Extension AROM - 3 x daily - 7 x weekly - 2 sets - 10 reps  Supine Short Arc Quad - 3 x daily - 7 x weekly - 2 sets - 10 reps  Seated Long Arc Quad - 3 x daily - 7 x weekly - 2 sets - 10 reps - 3-5 seconds hold  Seated Hamstring Stretch - 3 x daily - 7 x weekly - 5 reps - 30 seconds hold  Supine Active Straight Leg Raise - 3 x daily - 7 x weekly - 2 sets - 10 reps    Consulted and Agree with Plan of Care Patient             Patient will benefit from skilled therapeutic intervention in order to improve the following deficits and impairments:  Pain, Difficulty walking, Decreased range of motion, Decreased activity tolerance, Decreased balance, Impaired flexibility, Decreased mobility, Decreased strength, Increased edema  Visit Diagnosis: Difficulty in walking, not elsewhere classified  Muscle weakness (generalized)  Localized edema  Stiffness of right knee, not elsewhere classified     Problem  List Patient Active Problem List   Diagnosis Date Noted   Arthritis of right knee 12/12/2020   Status post total right knee replacement 12/12/2020   Degenerative arthritis of knee, bilateral 06/07/2020   Status post total replacement of right hip 04/23/2019   Unilateral primary osteoarthritis, right hip 04/22/2019   Unilateral primary osteoarthritis, left hip 06/25/2018    Sharmon Leyden, PT, MPT 01/02/2021, 12:26 PM  [facility] Physical Therapy [address] [place], [place], [address] Phone: [phone]   Fax:  [phone]  Name: MAZAL EBEY MRN: [id number] Date of Birth: 1946/01/16

## 2021-01-04 ENCOUNTER — Other Ambulatory Visit: Payer: Self-pay | Admitting: Orthopaedic Surgery

## 2021-01-04 ENCOUNTER — Encounter: Payer: Medicare Other | Admitting: Physical Therapy

## 2021-01-04 ENCOUNTER — Telehealth: Payer: Self-pay | Admitting: Orthopaedic Surgery

## 2021-01-04 MED ORDER — OXYCODONE HCL 5 MG PO TABS: 5.0000 mg | ORAL_TABLET | Freq: Four times a day (QID) | ORAL | 0 refills | Status: DC | PRN

## 2021-01-04 NOTE — Telephone Encounter (Signed)
Pt's daughter calling requesting a refill on her oxycodone. Pt's daughter said the best pharmacy is the [facility] on [address] and the best call back number is [phone] which is daughter's phone number.

## 2021-01-04 NOTE — Telephone Encounter (Signed)
Patient's daughter Gabriel Rung called advised her mother is out of pain medicine as of last night. Monique said her mother has (PT) and is in a lot of pain when she finishes (PT).    Please see previous message.  The number to contact Bridgett is [phone]

## 2021-01-04 NOTE — Telephone Encounter (Signed)
Please advise 

## 2021-01-05 ENCOUNTER — Other Ambulatory Visit: Payer: Self-pay | Admitting: Orthopaedic Surgery

## 2021-01-05 MED ORDER — OXYCODONE HCL 5 MG PO TABS: 5.0000 mg | ORAL_TABLET | Freq: Four times a day (QID) | ORAL | 0 refills | Status: DC | PRN

## 2021-01-08 ENCOUNTER — Ambulatory Visit (INDEPENDENT_AMBULATORY_CARE_PROVIDER_SITE_OTHER): Payer: Medicare Other | Admitting: Rehabilitative and Restorative Service Providers"

## 2021-01-08 ENCOUNTER — Other Ambulatory Visit: Payer: Self-pay

## 2021-01-08 ENCOUNTER — Encounter: Payer: Self-pay | Admitting: Rehabilitative and Restorative Service Providers"

## 2021-01-08 DIAGNOSIS — R262 Difficulty in walking, not elsewhere classified: Secondary | ICD-10-CM | POA: Diagnosis not present

## 2021-01-08 DIAGNOSIS — M6281 Muscle weakness (generalized): Secondary | ICD-10-CM

## 2021-01-08 DIAGNOSIS — R600 Localized edema: Secondary | ICD-10-CM

## 2021-01-08 DIAGNOSIS — M25661 Stiffness of right knee, not elsewhere classified: Secondary | ICD-10-CM

## 2021-01-08 DIAGNOSIS — M25561 Pain in right knee: Secondary | ICD-10-CM

## 2021-01-08 NOTE — Therapy (Signed)
[facility] Physical Therapy [address] [place], [place], [address] Phone: [phone]   Fax:  [phone]  Physical Therapy Treatment  Patient Details  Name: Jane Vasquez MRN: [id number] Date of Birth: [birth year] Referring Provider (PT): Gordy Savers MD   Encounter Date: 01/08/2021   PT End of Session - 01/08/21 1128     Visit Number 4    Number of Visits 24    Date for PT Re-Evaluation 03/23/21    Authorization Type Medicare    Progress Note Due on Visit 10    PT Start Time 1107    PT Stop Time 1156    PT Time Calculation (min) 49 min    Activity Tolerance Patient tolerated treatment well;No increased pain    Behavior During Therapy WFL for tasks assessed/performed             Past Medical History:  Diagnosis Date   Gout    Gout    HTN (hypertension)    Osteoarthritis    left knee , right hip     Past Surgical History:  Procedure Laterality Date   APPENDECTOMY  age 67   JOINT REPLACEMENT     TOTAL HIP ARTHROPLASTY Right 04/23/2019   Procedure: RIGHT TOTAL HIP ARTHROPLASTY ANTERIOR APPROACH;  Surgeon: Kathryne Hitch, MD;  Location: [facility];  Service: Orthopedics;  Laterality: Right;   TOTAL KNEE ARTHROPLASTY Right 12/12/2020   Procedure: RIGHT TOTAL KNEE ARTHROPLASTY;  Surgeon: Kathryne Hitch, MD;  Location: [facility];  Service: Orthopedics;  Laterality: Right;    There were no vitals filed for this visit.   Subjective Assessment - 01/08/21 1111     Subjective My last therapy was Tue; I couldn't come in on Thursday b/c of the pain. 4/10    Currently in Pain? Yes    Pain Score 4     Pain Location Knee    Pain Orientation Right    Pain Descriptors / Indicators Aching;Sore    Pain Type Surgical pain    Multiple Pain Sites No                               [facility] Adult PT Treatment/Exercise - 01/08/21 0001       Knee/Hip Exercises: Standing   Other Standing Knee Exercises weightshifting  forward/backward, side to side with PT CGA for safety and faciliation of motion; STS x 8 with concentration on control    Other Standing Knee Exercises at parallel bar: unilat bil hamstring curl x 20 each side.      Knee/Hip Exercises: Supine   Other Supine Knee/Hip Exercises knee AROM flexion x 20 with towel under foot; quad set x 20; SLR 2x10; SAQ x 20, iso SAQ with ankle pump x 20; SLR/hip abdct combo x 20, heel slides again with towel  x 15    Other Supine Knee/Hip Exercises bridge with increased knee flexion after each bout 3x5                      PT Short Term Goals - 01/08/21 1209       PT SHORT TERM GOAL #1   Title Pt will be independent in her initial HEP.    Status On-going      PT SHORT TERM GOAL #2   Title Pt will be able to amb > 150 feet on level surfaces with straight cane safely.    Status On-going  PT SHORT TERM GOAL #3   Title Perform FOTO next visit and update predicted value in LTG.    Status On-going               PT Long Term Goals - 12/28/20 1620       PT LONG TERM GOAL #1   Title Pt will be independent in advanced HEP.    Time 12    Period Weeks    Status On-going      PT LONG TERM GOAL #2   Title Pt will be able to perform 5 times sit to stand in </= 14 seconds using UE support    Baseline 54 seconds with UE support    Time 12    Period Weeks    Status On-going      PT LONG TERM GOAL #3   Title Pt will improve her right knee AROM to >/= 120 degrees to improve functional mobility.    Baseline AROM 70 degrees on 12/27/2020    Time 12    Period Weeks    Status On-going      PT LONG TERM GOAL #4   Title Pt will be able to amb with no device on community level surfaces safely for >/= 500 feet.    Baseline amb short distances with rollling walker    Time 12    Period Weeks    Status On-going      PT LONG TERM GOAL #5   Title Pt will improve her right knee strength to >/= 4+/5.    Baseline see flowsheets    Time 12     Period Weeks    Status On-going      PT LONG TERM GOAL #6   Title pt will improve her FOTO score to predicted outcome.    Baseline 51 (Goal 65)    Time 12    Period Weeks    Status On-going                   Plan - 01/08/21 1138     Clinical Impression Statement Pt presents to PT with limitation in R knee ROM limited by stiffness but pt able to work through soreness without increased pain. Pt would benefit from further PT for knee ROM, strengthening and GT to improve function. Supine knee flexion AROM 90 degrees, ext -15 degrees at rest after tx. Pt able to tolerate all therex.    PT Treatment/Interventions ADLs/Self Care Home Management;Cryotherapy;Electrical Stimulation;DME Instruction;Gait training;Stair training;Functional mobility training;Therapeutic activities;Therapeutic exercise;Balance training;Patient/family education;Neuromuscular re-education;Manual techniques;Passive range of motion;Taping    PT Next Visit Plan Nustep,  LE strengthening bilaterally, right knee ROM (flexion emphasis), sit to stand, gait training heel to toe gait    Consulted and Agree with Plan of Care Patient;Family member/caregiver    Family Member Consulted spouse, daughter             Patient will benefit from skilled therapeutic intervention in order to improve the following deficits and impairments:  Pain, Difficulty walking, Decreased range of motion, Decreased activity tolerance, Decreased balance, Impaired flexibility, Decreased mobility, Decreased strength, Increased edema  Visit Diagnosis: Difficulty in walking, not elsewhere classified  Muscle weakness (generalized)  Localized edema  Stiffness of right knee, not elsewhere classified  Acute pain of right knee     Problem List Patient Active Problem List   Diagnosis Date Noted   Arthritis of right knee 12/12/2020   Status post total right knee replacement 12/12/2020  Degenerative arthritis of knee, bilateral 06/07/2020    Status post total replacement of right hip 04/23/2019   Unilateral primary osteoarthritis, right hip 04/22/2019   Unilateral primary osteoarthritis, left hip 06/25/2018    Luna Fuse, PT, DPT 01/08/2021, 12:12 PM  [facility] Physical Therapy [address] [place], [place], [address] Phone: [phone]   Fax:  [phone]  Name: Jane Vasquez MRN: [id number] Date of Birth: 06/26/1946

## 2021-01-08 NOTE — Patient Instructions (Signed)
Discussed with daughter, spouse and patient that patient may experience muscle soreness after treatment and if she does, she is to casually walk with her walker and still do her HEP. Discussed heel to toe gait with walker in prep for ambulating with a cane in the future.

## 2021-01-09 ENCOUNTER — Telehealth: Payer: Self-pay | Admitting: Orthopaedic Surgery

## 2021-01-09 ENCOUNTER — Other Ambulatory Visit: Payer: Self-pay | Admitting: Orthopaedic Surgery

## 2021-01-09 MED ORDER — TRAMADOL HCL 50 MG PO TABS: 100.0000 mg | ORAL_TABLET | Freq: Four times a day (QID) | ORAL | 0 refills | Status: DC | PRN

## 2021-01-09 NOTE — Progress Notes (Unsigned)
[email or web address] 

## 2021-01-09 NOTE — Telephone Encounter (Signed)
Patient's daughter Hughie Closs called advised her mother is getting nauseated from taking Oxycodone. Patient has an ulcer and can not continue taking the Oxycodone. Bridgette asked what other medication can her mother take?  Bridgette said Tylenol also makes her mother nauseated. Bridgett also said we can speak with her sister Gabriel Rung concerning her mother's care because she is traveling.  The number to contact Gabriel Rung is [phone]

## 2021-01-10 ENCOUNTER — Encounter: Payer: Self-pay | Admitting: Rehabilitative and Restorative Service Providers"

## 2021-01-10 ENCOUNTER — Other Ambulatory Visit: Payer: Self-pay

## 2021-01-10 ENCOUNTER — Ambulatory Visit (INDEPENDENT_AMBULATORY_CARE_PROVIDER_SITE_OTHER): Payer: Medicare Other | Admitting: Rehabilitative and Restorative Service Providers"

## 2021-01-10 DIAGNOSIS — M25661 Stiffness of right knee, not elsewhere classified: Secondary | ICD-10-CM | POA: Diagnosis not present

## 2021-01-10 DIAGNOSIS — M6281 Muscle weakness (generalized): Secondary | ICD-10-CM | POA: Diagnosis not present

## 2021-01-10 DIAGNOSIS — R262 Difficulty in walking, not elsewhere classified: Secondary | ICD-10-CM

## 2021-01-10 DIAGNOSIS — M25561 Pain in right knee: Secondary | ICD-10-CM | POA: Diagnosis not present

## 2021-01-10 DIAGNOSIS — R600 Localized edema: Secondary | ICD-10-CM

## 2021-01-10 NOTE — Therapy (Signed)
[facility] Physical Therapy [address] [place], [place], [address] Phone: [phone]   Fax:  [phone]  Physical Therapy Treatment  Patient Details  Name: Jane Vasquez MRN: [id number] Date of Birth: 05-Mar-1946 Referring Provider (PT): Gordy Savers MD   Encounter Date: 01/10/2021   PT End of Session - 01/10/21 1200     Visit Number 5    Number of Visits 24    Date for PT Re-Evaluation 03/23/21    Authorization Type Medicare    Progress Note Due on Visit 10    PT Start Time 1152    PT Stop Time 1230    PT Time Calculation (min) 38 min    Activity Tolerance Patient tolerated treatment well;No increased pain    Behavior During Therapy WFL for tasks assessed/performed             Past Medical History:  Diagnosis Date   Gout    Gout    HTN (hypertension)    Osteoarthritis    left knee , right hip     Past Surgical History:  Procedure Laterality Date   APPENDECTOMY  age 75   JOINT REPLACEMENT     TOTAL HIP ARTHROPLASTY Right 04/23/2019   Procedure: RIGHT TOTAL HIP ARTHROPLASTY ANTERIOR APPROACH;  Surgeon: Kathryne Hitch, MD;  Location: [facility];  Service: Orthopedics;  Laterality: Right;   TOTAL KNEE ARTHROPLASTY Right 12/12/2020   Procedure: RIGHT TOTAL KNEE ARTHROPLASTY;  Surgeon: Kathryne Hitch, MD;  Location: [facility];  Service: Orthopedics;  Laterality: Right;    There were no vitals filed for this visit.   Subjective Assessment - 01/10/21 1158     Subjective Pt. indicated feeling soreness in knee and leg and pain around 3-4/10 today.  Pt. indicated she is doing her stuff 3x day.    Patient is accompained by: Family member    Pertinent History gout, HTN, s/p R THA 2020, and OA, right THA October 2020.    Limitations House hold activities;Lifting;Standing;Walking    Diagnostic tests X-ray    Patient Stated Goals To be able to walk with my knee straight and get my mobility back    Currently in Pain? Yes    Pain  Score 3     Pain Location Knee    Pain Orientation Right    Pain Descriptors / Indicators Aching;Sore;Tightness    Pain Type Surgical pain    Pain Onset 1 to 4 weeks ago    Pain Frequency Constant    Aggravating Factors  exercises at times, prolonged static positioning for tightness    Pain Relieving Factors ice                               [facility] Adult PT Treatment/Exercise - 01/10/21 0001       Ambulation/Gait   Gait Comments Ambulation in clinic c gait training c SPC in Lt UE.  Visual demonstration, verbal cues for placement, sequencing.  Performed ambulation 50 ft c CGA c cues during for positioning.      Knee/Hip Exercises: Stretches   Passive Hamstring Stretch 30 seconds;3 reps;Right   foot on stool   Other Knee/Hip Stretches TKE stretch heel prop instruction, 2 mins      Knee/Hip Exercises: Aerobic   Nustep Lvl 5 UE/LE 6.5 mins      Knee/Hip Exercises: Machines for Strengthening   Total Gym Leg Press Rt leg 25 lbs 2 x 10 slow  eccentric control      Knee/Hip Exercises: Seated   Long Arc Quad Right;15 reps;AROM      Manual Therapy   Passive ROM seated Rt knee flexion mobilization c movement c distraction/IR                      PT Short Term Goals - 01/08/21 1209       PT SHORT TERM GOAL #1   Title Pt will be independent in her initial HEP.    Status On-going      PT SHORT TERM GOAL #2   Title Pt will be able to amb > 150 feet on level surfaces with straight cane safely.    Status On-going      PT SHORT TERM GOAL #3   Title Perform FOTO next visit and update predicted value in LTG.    Status On-going               PT Long Term Goals - 12/28/20 1620       PT LONG TERM GOAL #1   Title Pt will be independent in advanced HEP.    Time 12    Period Weeks    Status On-going      PT LONG TERM GOAL #2   Title Pt will be able to perform 5 times sit to stand in </= 14 seconds using UE support    Baseline 54 seconds with UE  support    Time 12    Period Weeks    Status On-going      PT LONG TERM GOAL #3   Title Pt will improve her right knee AROM to >/= 120 degrees to improve functional mobility.    Baseline AROM 70 degrees on 12/27/2020    Time 12    Period Weeks    Status On-going      PT LONG TERM GOAL #4   Title Pt will be able to amb with no device on community level surfaces safely for >/= 500 feet.    Baseline amb short distances with rollling walker    Time 12    Period Weeks    Status On-going      PT LONG TERM GOAL #5   Title Pt will improve her right knee strength to >/= 4+/5.    Baseline see flowsheets    Time 12    Period Weeks    Status On-going      PT LONG TERM GOAL #6   Title pt will improve her FOTO score to predicted outcome.    Baseline 51 (Goal 65)    Time 12    Period Weeks    Status On-going                   Plan - 01/10/21 1214     Clinical Impression Statement Pt. demonstrated fair to good control c SPC use today.  Unable to perform without CGA at this time so continued walker use advised at home and in community but instruction for cane purchase given today.  Pt. continued to present c symptoms of generalized soreness.  Discussed c Pt. importance of extension stretching (heel prop) as well as continued flexion movement in HEP.  Continued skilled PT services indicated.    Personal Factors and Comorbidities Comorbidity 3+    Comorbidities S/p Right TKA  on 6/14.         PMH: gout, HTN, s/p R THA 2020, and OA.  Examination-Activity Limitations Bathing;Dressing;Squat;Stairs;Stand;Other;Sit;Sleep    Examination-Participation Restrictions Community Activity;Other;Church;Driving    Rehab Potential Good    PT Frequency 2x / week    PT Duration 12 weeks    PT Treatment/Interventions ADLs/Self Care Home Management;Cryotherapy;Electrical Stimulation;DME Instruction;Gait training;Stair training;Functional mobility training;Therapeutic activities;Therapeutic  exercise;Balance training;Patient/family education;Neuromuscular re-education;Manual techniques;Passive range of motion;Taping    PT Next Visit Plan [facility] training, static balance, quad strengthening, manual for mobility gains.  (VASO not included in evaluation plan - see above)    PT Home Exercise Plan Access Code: [email or web address]  URL: [email or web address]  Date: 12/27/2020  Prepared by: Narda Amber    Exercises  Supine Heel Slide with Strap - 3 x daily - 7 x weekly - 2 sets - 10 reps - 10 seconds hold  Seated Knee Flexion Extension AROM - 3 x daily - 7 x weekly - 2 sets - 10 reps  Supine Short Arc Quad - 3 x daily - 7 x weekly - 2 sets - 10 reps  Seated Long Arc Quad - 3 x daily - 7 x weekly - 2 sets - 10 reps - 3-5 seconds hold  Seated Hamstring Stretch - 3 x daily - 7 x weekly - 5 reps - 30 seconds hold  Supine Active Straight Leg Raise - 3 x daily - 7 x weekly - 2 sets - 10 reps    Consulted and Agree with Plan of Care Patient    Family Member Consulted --             Patient will benefit from skilled therapeutic intervention in order to improve the following deficits and impairments:  Pain, Difficulty walking, Decreased range of motion, Decreased activity tolerance, Decreased balance, Impaired flexibility, Decreased mobility, Decreased strength, Increased edema  Visit Diagnosis: Muscle weakness (generalized)  Stiffness of right knee, not elsewhere classified  Acute pain of right knee  Localized edema  Difficulty in walking, not elsewhere classified     Problem List Patient Active Problem List   Diagnosis Date Noted   Arthritis of right knee 12/12/2020   Status post total right knee replacement 12/12/2020   Degenerative arthritis of knee, bilateral 06/07/2020   Status post total replacement of right hip 04/23/2019   Unilateral primary osteoarthritis, right hip 04/22/2019   Unilateral primary osteoarthritis, left hip 06/25/2018   Chyrel Masson, PT, DPT, OCS,  ATC 01/10/21  12:58 PM    [facility] Physical Therapy [address] [place], [place], [address] Phone: [phone]   Fax:  [phone]  Name: CHRISTAIN MCRANEY MRN: [id number] Date of Birth: [birth year]

## 2021-01-16 ENCOUNTER — Other Ambulatory Visit: Payer: Self-pay

## 2021-01-16 ENCOUNTER — Ambulatory Visit (INDEPENDENT_AMBULATORY_CARE_PROVIDER_SITE_OTHER): Payer: Medicare Other | Admitting: Rehabilitative and Restorative Service Providers"

## 2021-01-16 ENCOUNTER — Encounter: Payer: Self-pay | Admitting: Rehabilitative and Restorative Service Providers"

## 2021-01-16 DIAGNOSIS — M6281 Muscle weakness (generalized): Secondary | ICD-10-CM | POA: Diagnosis not present

## 2021-01-16 DIAGNOSIS — M25561 Pain in right knee: Secondary | ICD-10-CM | POA: Diagnosis not present

## 2021-01-16 DIAGNOSIS — M25661 Stiffness of right knee, not elsewhere classified: Secondary | ICD-10-CM

## 2021-01-16 DIAGNOSIS — R600 Localized edema: Secondary | ICD-10-CM | POA: Diagnosis not present

## 2021-01-16 DIAGNOSIS — R262 Difficulty in walking, not elsewhere classified: Secondary | ICD-10-CM

## 2021-01-16 NOTE — Therapy (Signed)
[facility] Physical Therapy [address] [place], [place], [address] Phone: [phone]   Fax:  [phone]  Physical Therapy Treatment  Patient Details  Name: Jane Vasquez MRN: [id number] Date of Birth: 07-01-1946 Referring Provider (PT): Gordy Savers MD   Encounter Date: 01/16/2021   PT End of Session - 01/16/21 1159     Visit Number 6    Number of Visits 24    Date for PT Re-Evaluation 03/23/21    Authorization Type Medicare    Progress Note Due on Visit 10    PT Start Time 1143    PT Stop Time 1222    PT Time Calculation (min) 39 min    Activity Tolerance Patient tolerated treatment well;No increased pain    Behavior During Therapy WFL for tasks assessed/performed             Past Medical History:  Diagnosis Date   Gout    Gout    HTN (hypertension)    Osteoarthritis    left knee , right hip     Past Surgical History:  Procedure Laterality Date   APPENDECTOMY  age 75   JOINT REPLACEMENT     TOTAL HIP ARTHROPLASTY Right 04/23/2019   Procedure: RIGHT TOTAL HIP ARTHROPLASTY ANTERIOR APPROACH;  Surgeon: Kathryne Hitch, MD;  Location: [facility];  Service: Orthopedics;  Laterality: Right;   TOTAL KNEE ARTHROPLASTY Right 12/12/2020   Procedure: RIGHT TOTAL KNEE ARTHROPLASTY;  Surgeon: Kathryne Hitch, MD;  Location: [facility];  Service: Orthopedics;  Laterality: Right;    There were no vitals filed for this visit.   Subjective Assessment - 01/16/21 1200     Subjective Pt. stated pain about 3/10 or so.  Feeling fairly well.  Expressed concern about getting better at steps for going home.    Patient is accompained by: Family member    Pertinent History gout, HTN, s/p R THA 2020, and OA, right THA October 2020.    Limitations House hold activities;Lifting;Standing;Walking    Diagnostic tests X-ray    Patient Stated Goals To be able to walk with my knee straight and get my mobility back    Currently in Pain? Yes    Pain  Location Knee    Pain Orientation Right    Pain Descriptors / Indicators Aching;Sore;Tightness    Pain Onset 1 to 4 weeks ago    Aggravating Factors  static positioning    Pain Relieving Factors ice                               [facility] Adult PT Treatment/Exercise - 01/16/21 0001       Neuro Re-ed    Neuro Re-ed Details  fwd/rev ambulation in bars 10 ft x 6 each way c cues for step length improvements in reverse, retro step 20x each LE      Knee/Hip Exercises: Aerobic   Nustep Lvl 6 8 mins UE/LE      Knee/Hip Exercises: Seated   Long Arc Quad Right;3 sets;10 reps   pause in extension and flexion stretch, contralateral leg movement opposite   Long Arc Quad Weight 3 lbs.      Manual Therapy   Passive ROM seated Rt knee flexion mobilization c movement c distraction/IR                      PT Short Term Goals - 01/08/21 1209  PT SHORT TERM GOAL #1   Title Pt will be independent in her initial HEP.    Status On-going      PT SHORT TERM GOAL #2   Title Pt will be able to amb > 150 feet on level surfaces with straight cane safely.    Status On-going      PT SHORT TERM GOAL #3   Title Perform FOTO next visit and update predicted value in LTG.    Status On-going               PT Long Term Goals - 12/28/20 1620       PT LONG TERM GOAL #1   Title Pt will be independent in advanced HEP.    Time 12    Period Weeks    Status On-going      PT LONG TERM GOAL #2   Title Pt will be able to perform 5 times sit to stand in </= 14 seconds using UE support    Baseline 54 seconds with UE support    Time 12    Period Weeks    Status On-going      PT LONG TERM GOAL #3   Title Pt will improve her right knee AROM to >/= 120 degrees to improve functional mobility.    Baseline AROM 70 degrees on 12/27/2020    Time 12    Period Weeks    Status On-going      PT LONG TERM GOAL #4   Title Pt will be able to amb with no device on community  level surfaces safely for >/= 500 feet.    Baseline amb short distances with rollling walker    Time 12    Period Weeks    Status On-going      PT LONG TERM GOAL #5   Title Pt will improve her right knee strength to >/= 4+/5.    Baseline see flowsheets    Time 12    Period Weeks    Status On-going      PT LONG TERM GOAL #6   Title pt will improve her FOTO score to predicted outcome.    Baseline 51 (Goal 65)    Time 12    Period Weeks    Status On-going                   Plan - 01/16/21 1203     Clinical Impression Statement Improving in weight acceptance on Rt leg at this time in ambulation.  Unable to perform ascending 6 inch step without assistance due to difficulty.  Continued emphasis on extension and flexion gains and transitioning towards cane use.    Personal Factors and Comorbidities Comorbidity 3+    Comorbidities S/p Right TKA  on 6/14.         PMH: gout, HTN, s/p R THA 2020, and OA.    Examination-Activity Limitations Bathing;Dressing;Squat;Stairs;Stand;Other;Sit;Sleep    Examination-Participation Restrictions Community Activity;Other;Church;Driving    Rehab Potential Good    PT Frequency 2x / week    PT Duration 12 weeks    PT Treatment/Interventions ADLs/Self Care Home Management;Cryotherapy;Electrical Stimulation;DME Instruction;Gait training;Stair training;Functional mobility training;Therapeutic activities;Therapeutic exercise;Balance training;Patient/family education;Neuromuscular re-education;Manual techniques;Passive range of motion;Taping    PT Next Visit Plan [facility] training, static balance, quad strengthening, manual for mobility gains. Improve stair navigation  (VASO not included in evaluation plan - see above)    PT Home Exercise Plan Access Code: [email or web address]  URL: [email or web address]  Date: 12/27/2020  Prepared by: Narda Amber    Exercises  Supine Heel Slide with Strap - 3 x daily - 7 x weekly - 2 sets - 10 reps - 10 seconds hold   Seated Knee Flexion Extension AROM - 3 x daily - 7 x weekly - 2 sets - 10 reps  Supine Short Arc Quad - 3 x daily - 7 x weekly - 2 sets - 10 reps  Seated Long Arc Quad - 3 x daily - 7 x weekly - 2 sets - 10 reps - 3-5 seconds hold  Seated Hamstring Stretch - 3 x daily - 7 x weekly - 5 reps - 30 seconds hold  Supine Active Straight Leg Raise - 3 x daily - 7 x weekly - 2 sets - 10 reps    Consulted and Agree with Plan of Care Patient             Patient will benefit from skilled therapeutic intervention in order to improve the following deficits and impairments:  Pain, Difficulty walking, Decreased range of motion, Decreased activity tolerance, Decreased balance, Impaired flexibility, Decreased mobility, Decreased strength, Increased edema  Visit Diagnosis: Muscle weakness (generalized)  Stiffness of right knee, not elsewhere classified  Acute pain of right knee  Localized edema  Difficulty in walking, not elsewhere classified     Problem List Patient Active Problem List   Diagnosis Date Noted   Arthritis of right knee 12/12/2020   Status post total right knee replacement 12/12/2020   Degenerative arthritis of knee, bilateral 06/07/2020   Status post total replacement of right hip 04/23/2019   Unilateral primary osteoarthritis, right hip 04/22/2019   Unilateral primary osteoarthritis, left hip 06/25/2018    Chyrel Masson, PT, DPT, OCS, ATC 01/16/21  12:19 PM    [facility] Physical Therapy [address] [place], [place], [address] Phone: [phone]   Fax:  [phone]  Name: Jane Vasquez MRN: [id number] Date of Birth: 1945-09-16

## 2021-01-19 ENCOUNTER — Other Ambulatory Visit: Payer: Self-pay

## 2021-01-19 ENCOUNTER — Encounter: Payer: Self-pay | Admitting: Rehabilitative and Restorative Service Providers"

## 2021-01-19 ENCOUNTER — Ambulatory Visit (INDEPENDENT_AMBULATORY_CARE_PROVIDER_SITE_OTHER): Payer: Medicare Other | Admitting: Rehabilitative and Restorative Service Providers"

## 2021-01-19 DIAGNOSIS — M6281 Muscle weakness (generalized): Secondary | ICD-10-CM | POA: Diagnosis not present

## 2021-01-19 DIAGNOSIS — R600 Localized edema: Secondary | ICD-10-CM | POA: Diagnosis not present

## 2021-01-19 DIAGNOSIS — M25661 Stiffness of right knee, not elsewhere classified: Secondary | ICD-10-CM

## 2021-01-19 DIAGNOSIS — R262 Difficulty in walking, not elsewhere classified: Secondary | ICD-10-CM

## 2021-01-19 DIAGNOSIS — M25561 Pain in right knee: Secondary | ICD-10-CM

## 2021-01-19 NOTE — Therapy (Signed)
[facility] Physical Therapy [address] [place], [place], [address] Phone: [phone]   Fax:  [phone]  Physical Therapy Treatment  Patient Details  Name: Jane Vasquez MRN: [id number] Date of Birth: 03-16-1946 Referring Provider (PT): Gordy Savers MD   Encounter Date: 01/19/2021   PT End of Session - 01/19/21 1157     Visit Number 7    Number of Visits 24    Date for PT Re-Evaluation 03/23/21    Authorization Type Medicare    Progress Note Due on Visit 10    PT Start Time 1147    PT Stop Time 1226    PT Time Calculation (min) 39 min    Activity Tolerance Patient tolerated treatment well;No increased pain    Behavior During Therapy WFL for tasks assessed/performed             Past Medical History:  Diagnosis Date   Gout    Gout    HTN (hypertension)    Osteoarthritis    left knee , right hip     Past Surgical History:  Procedure Laterality Date   APPENDECTOMY  age 33   JOINT REPLACEMENT     TOTAL HIP ARTHROPLASTY Right 04/23/2019   Procedure: RIGHT TOTAL HIP ARTHROPLASTY ANTERIOR APPROACH;  Surgeon: Kathryne Hitch, MD;  Location: [facility];  Service: Orthopedics;  Laterality: Right;   TOTAL KNEE ARTHROPLASTY Right 12/12/2020   Procedure: RIGHT TOTAL KNEE ARTHROPLASTY;  Surgeon: Kathryne Hitch, MD;  Location: [facility];  Service: Orthopedics;  Laterality: Right;    There were no vitals filed for this visit.       [facility] PT Assessment - 01/19/21 0001       Assessment   Medical Diagnosis Z96.651 s/p rigtht TKA    Referring Provider (PT) Gordy Savers MD    Onset Date/Surgical Date 12/12/20    Hand Dominance Right      Ambulation/Gait   Gait Comments SPC in Lt UE c step to gait pattern, SBA c cues for sequencing                           [facility] Adult PT Treatment/Exercise - 01/19/21 0001       Therapeutic Activites    Therapeutic Activities Other Therapeutic Activities    Other  Therapeutic Activities stair navigation up/down c single hand rail and cues for easier option (good leg up, surgery leg down) to improve household navigation (approx. 30 steps performed with additional time spent on cues). SPC use in clinic 30-50 ft several times for improved confidence for home use.      Knee/Hip Exercises: Aerobic   Nustep Lvl 6 10 mins UE/LE      Knee/Hip Exercises: Standing   Forward Step Up Step Height: 4";2 sets;10 reps;Hand Hold: 2      Knee/Hip Exercises: Seated   Long Arc Quad 3 sets;10 reps;Right    Long Arc Quad Weight 3 lbs.    Sit to [facility] without UE support   20 inch chair, poor control in lowering, UE required for standing                     PT Short Term Goals - 01/08/21 1209       PT SHORT TERM GOAL #1   Title Pt will be independent in her initial HEP.    Status On-going      PT SHORT TERM GOAL #2   Title  Pt will be able to amb > 150 feet on level surfaces with straight cane safely.    Status On-going      PT SHORT TERM GOAL #3   Title Perform FOTO next visit and update predicted value in LTG.    Status On-going               PT Long Term Goals - 12/28/20 1620       PT LONG TERM GOAL #1   Title Pt will be independent in advanced HEP.    Time 12    Period Weeks    Status On-going      PT LONG TERM GOAL #2   Title Pt will be able to perform 5 times sit to stand in </= 14 seconds using UE support    Baseline 54 seconds with UE support    Time 12    Period Weeks    Status On-going      PT LONG TERM GOAL #3   Title Pt will improve her right knee AROM to >/= 120 degrees to improve functional mobility.    Baseline AROM 70 degrees on 12/27/2020    Time 12    Period Weeks    Status On-going      PT LONG TERM GOAL #4   Title Pt will be able to amb with no device on community level surfaces safely for >/= 500 feet.    Baseline amb short distances with rollling walker    Time 12    Period Weeks    Status On-going       PT LONG TERM GOAL #5   Title Pt will improve her right knee strength to >/= 4+/5.    Baseline see flowsheets    Time 12    Period Weeks    Status On-going      PT LONG TERM GOAL #6   Title pt will improve her FOTO score to predicted outcome.    Baseline 51 (Goal 65)    Time 12    Period Weeks    Status On-going                   Plan - 01/19/21 1155     Clinical Impression Statement SPC use started with step to gait pattern but was able to improve to include some step through movement, though limited compared to normal step lengths.  Unable to perform sit to stand from 18 inch chair s UE assist secondary to weakness. Continued skilled PT services indicated.    Personal Factors and Comorbidities Comorbidity 3+    Comorbidities S/p Right TKA  on 6/14.         PMH: gout, HTN, s/p R THA 2020, and OA.    Examination-Activity Limitations Bathing;Dressing;Squat;Stairs;Stand;Other;Sit;Sleep    Examination-Participation Restrictions Community Activity;Other;Church;Driving    Rehab Potential Good    PT Frequency 2x / week    PT Duration 12 weeks    PT Treatment/Interventions ADLs/Self Care Home Management;Cryotherapy;Electrical Stimulation;DME Instruction;Gait training;Stair training;Functional mobility training;Therapeutic activities;Therapeutic exercise;Balance training;Patient/family education;Neuromuscular re-education;Manual techniques;Passive range of motion;Taping    PT Next Visit Plan Continued SPC use, strengthening and improved stair navigation  (VASO not included in evaluation plan - see above).  Assess LTG    PT Home Exercise Plan Access Code: [email or web address]  URL: [email or web address]  Date: 12/27/2020  Prepared by: Narda Amber    Exercises  Supine Heel Slide with Strap - 3 x daily - 7 x weekly - 2 sets -  10 reps - 10 seconds hold  Seated Knee Flexion Extension AROM - 3 x daily - 7 x weekly - 2 sets - 10 reps  Supine Short Arc Quad - 3 x daily - 7 x weekly - 2 sets  - 10 reps  Seated Long Arc Quad - 3 x daily - 7 x weekly - 2 sets - 10 reps - 3-5 seconds hold  Seated Hamstring Stretch - 3 x daily - 7 x weekly - 5 reps - 30 seconds hold  Supine Active Straight Leg Raise - 3 x daily - 7 x weekly - 2 sets - 10 reps    Consulted and Agree with Plan of Care Patient             Patient will benefit from skilled therapeutic intervention in order to improve the following deficits and impairments:  Pain, Difficulty walking, Decreased range of motion, Decreased activity tolerance, Decreased balance, Impaired flexibility, Decreased mobility, Decreased strength, Increased edema  Visit Diagnosis: Muscle weakness (generalized)  Stiffness of right knee, not elsewhere classified  Acute pain of right knee  Localized edema  Difficulty in walking, not elsewhere classified     Problem List Patient Active Problem List   Diagnosis Date Noted   Arthritis of right knee 12/12/2020   Status post total right knee replacement 12/12/2020   Degenerative arthritis of knee, bilateral 06/07/2020   Status post total replacement of right hip 04/23/2019   Unilateral primary osteoarthritis, right hip 04/22/2019   Unilateral primary osteoarthritis, left hip 06/25/2018    Chyrel Masson, PT, DPT, OCS, ATC 01/19/21  12:34 PM    [facility] Physical Therapy [address] [place], [place], [address] Phone: [phone]   Fax:  [phone]  Name: Jane Vasquez MRN: [id number] Date of Birth: 27-Jun-1946

## 2021-01-23 ENCOUNTER — Ambulatory Visit (INDEPENDENT_AMBULATORY_CARE_PROVIDER_SITE_OTHER): Payer: Medicare Other | Admitting: Physical Therapy

## 2021-01-23 ENCOUNTER — Encounter: Payer: Self-pay | Admitting: Physical Therapy

## 2021-01-23 ENCOUNTER — Other Ambulatory Visit: Payer: Self-pay

## 2021-01-23 ENCOUNTER — Encounter: Payer: Self-pay | Admitting: Orthopaedic Surgery

## 2021-01-23 ENCOUNTER — Ambulatory Visit (INDEPENDENT_AMBULATORY_CARE_PROVIDER_SITE_OTHER): Payer: Medicare Other | Admitting: Orthopaedic Surgery

## 2021-01-23 DIAGNOSIS — M25561 Pain in right knee: Secondary | ICD-10-CM | POA: Diagnosis not present

## 2021-01-23 DIAGNOSIS — M25661 Stiffness of right knee, not elsewhere classified: Secondary | ICD-10-CM

## 2021-01-23 DIAGNOSIS — R600 Localized edema: Secondary | ICD-10-CM | POA: Diagnosis not present

## 2021-01-23 DIAGNOSIS — Z96651 Presence of right artificial knee joint: Secondary | ICD-10-CM

## 2021-01-23 DIAGNOSIS — R262 Difficulty in walking, not elsewhere classified: Secondary | ICD-10-CM

## 2021-01-23 DIAGNOSIS — M6281 Muscle weakness (generalized): Secondary | ICD-10-CM | POA: Diagnosis not present

## 2021-01-23 MED ORDER — TRAMADOL HCL 50 MG PO TABS: 100.0000 mg | ORAL_TABLET | Freq: Four times a day (QID) | ORAL | 0 refills | Status: DC | PRN

## 2021-01-23 NOTE — Therapy (Signed)
[facility] Physical Therapy [address] [place], [place], [address] Phone: [phone]   Fax:  [phone]  Physical Therapy Treatment  Patient Details  Name: Jane Vasquez MRN: [id number] Date of Birth: Jul 06, 1945 Referring Provider (PT): Gordy Savers MD   Encounter Date: 01/23/2021   PT End of Session - 01/23/21 1338     Visit Number 8    Number of Visits 24    Date for PT Re-Evaluation 03/23/21    Authorization Type Medicare    Progress Note Due on Visit 10    PT Start Time 1150    PT Stop Time 1230    PT Time Calculation (min) 40 min    Equipment Utilized During Treatment Gait belt    Activity Tolerance Patient tolerated treatment well;No increased pain    Behavior During Therapy WFL for tasks assessed/performed             Past Medical History:  Diagnosis Date   Gout    Gout    HTN (hypertension)    Osteoarthritis    left knee , right hip     Past Surgical History:  Procedure Laterality Date   APPENDECTOMY  age 19   JOINT REPLACEMENT     TOTAL HIP ARTHROPLASTY Right 04/23/2019   Procedure: RIGHT TOTAL HIP ARTHROPLASTY ANTERIOR APPROACH;  Surgeon: Kathryne Hitch, MD;  Location: [facility];  Service: Orthopedics;  Laterality: Right;   TOTAL KNEE ARTHROPLASTY Right 12/12/2020   Procedure: RIGHT TOTAL KNEE ARTHROPLASTY;  Surgeon: Kathryne Hitch, MD;  [facility]: [facility];  Service: Orthopedics;  Laterality: Right;    There were no vitals filed for this visit.   Subjective Assessment - 01/23/21 1200     Subjective Pt arriving stating 3/10 pain in right knee.    Pertinent History gout, HTN, s/p R THA 2020, and OA, right THA October 2020.    Limitations House hold activities;Lifting;Standing;Walking    Diagnostic tests X-ray    Patient Stated Goals To be able to walk with my knee straight and get my mobility back    Currently in Pain? Yes    Pain Score 3     Pain Location Knee    Pain Orientation Right    Pain  Descriptors / Indicators Aching;Sore    Pain Type Surgical pain    Pain Onset 1 to 4 weeks ago                [facility] PT Assessment - 01/23/21 0001       Assessment   Medical Diagnosis Z96.651 s/p rigtht TKA    Referring Provider (PT) Gordy Savers MD    Onset Date/Surgical Date 12/12/20      AROM   Right/Left Knee Right    Right Knee Extension 5    Right Knee Flexion 85      PROM   Right/Left Knee Right    Right Knee Extension 0    Right Knee Flexion 92      Ambulation/Gait   Gait Comments SPC in left UE step through gait pattern with decreased step length bilaterally                           [facility] Adult PT Treatment/Exercise - 01/23/21 0001       Therapeutic Activites    Other Therapeutic Activities up and down on step in clinic using single rail for support stepping with right LE first, pt instructed to allow left  toes to hang over the edge to assist with icreased knee flexion and hip flexion on the left as pt is stepping down.      Knee/Hip Exercises: Aerobic   Nustep Lvl 6 8  mins UE/LE      Knee/Hip Exercises: Machines for Strengthening   Total Gym Leg Press bilateral LE's 75# 2 x10, right LE only: 50# 2x10      Knee/Hip Exercises: Standing   Forward Step Up Both;15 reps;Hand Hold: 1;Step Height: 4";Step Height: 6"      Knee/Hip Exercises: Seated   Long Arc Quad 3 sets;10 reps;Right    Long Arc Quad Weight 3 lbs.    Other Seated Knee/Hip Exercises attempted seated SLR x 5 pt compensating by leaning backward    Sit to Sand with UE support;2 sets;10 reps;Other (comment)   pt needed verbal instructions for eccentric control when sitting     Knee/Hip Exercises: Supine   Heel Slides AROM;Right;5 reps    Straight Leg Raises Strengthening;2 sets;10 reps      Manual Therapy   Passive ROM PROM in supine knee fleixion and extension.                      PT Short Term Goals - 01/23/21 1340       PT SHORT TERM GOAL #1    Title Pt will be independent in her initial HEP.    Status On-going      PT SHORT TERM GOAL #2   Title Pt will be able to amb > 150 feet on level surfaces with straight cane safely.    Status On-going      PT SHORT TERM GOAL #3   Title Perform FOTO next visit and update predicted value in LTG.    Status Achieved               PT Long Term Goals - 01/23/21 1340       PT LONG TERM GOAL #1   Title Pt will be independent in advanced HEP.    Status On-going      PT LONG TERM GOAL #2   Title Pt will be able to perform 5 times sit to stand in </= 14 seconds using UE support    Status On-going      PT LONG TERM GOAL #3   Title Pt will improve her right knee AROM to >/= 120 degrees to improve functional mobility.    Status On-going      PT LONG TERM GOAL #4   Title Pt will be able to amb with no device on community level surfaces safely for >/= 500 feet.    Status On-going      PT LONG TERM GOAL #5   Title Pt will improve her right knee strength to >/= 4+/5.    Status On-going      PT LONG TERM GOAL #6   Title pt will improve her FOTO score to predicted outcome.    Status On-going                   Plan - 01/23/21 1256     Clinical Impression Statement Pt making progress with SPC gait pattern with step through. Still presenting with decreased step length. We worked on sit to stand from standard arm chair in clinic. Pt still requiring UE support with decreased eccentric control when sitting. Focused on step up and down with bilateral LE on 4 and 6 inch step.  Pt has made progress with AROM over the last 2 weeks increasing by 10 degrees. Continue skilled PT to maximize pt's function.    Comorbidities S/p Right TKA  on 6/14.         PMH: gout, HTN, s/p R THA 2020, and OA.    Examination-Activity Limitations Bathing;Dressing;Squat;Stairs;Stand;Other;Sit;Sleep    Examination-Participation Restrictions Community Activity;Other;Church;Driving    Stability/Clinical  Decision Making Stable/Uncomplicated    Rehab Potential Good    PT Frequency 2x / week    PT Duration 12 weeks    PT Treatment/Interventions ADLs/Self Care Home Management;Cryotherapy;Electrical Stimulation;DME Instruction;Gait training;Stair training;Functional mobility training;Therapeutic activities;Therapeutic exercise;Balance training;Patient/family education;Neuromuscular re-education;Manual techniques;Passive range of motion;Taping    PT Next Visit Plan Continued SPC use, strengthening and improved stair navigation  (VASO not included in evaluation plan - see above).  Assess LTG    PT Home Exercise Plan Access Code: [email or web address]  URL: [email or web address]  Date: 12/27/2020  Prepared by: Narda Amber    Exercises  Supine Heel Slide with Strap - 3 x daily - 7 x weekly - 2 sets - 10 reps - 10 seconds hold  Seated Knee Flexion Extension AROM - 3 x daily - 7 x weekly - 2 sets - 10 reps  Supine Short Arc Quad - 3 x daily - 7 x weekly - 2 sets - 10 reps  Seated Long Arc Quad - 3 x daily - 7 x weekly - 2 sets - 10 reps - 3-5 seconds hold  Seated Hamstring Stretch - 3 x daily - 7 x weekly - 5 reps - 30 seconds hold  Supine Active Straight Leg Raise - 3 x daily - 7 x weekly - 2 sets - 10 reps    Consulted and Agree with Plan of Care Patient             Patient will benefit from skilled therapeutic intervention in order to improve the following deficits and impairments:  Pain, Difficulty walking, Decreased range of motion, Decreased activity tolerance, Decreased balance, Impaired flexibility, Decreased mobility, Decreased strength, Increased edema  Visit Diagnosis: Muscle weakness (generalized)  Stiffness of right knee, not elsewhere classified  Acute pain of right knee  Localized edema  Difficulty in walking, not elsewhere classified     Problem List Patient Active Problem List   Diagnosis Date Noted   Arthritis of right knee 12/12/2020   Status post total right knee  replacement 12/12/2020   Degenerative arthritis of knee, bilateral 06/07/2020   Status post total replacement of right hip 04/23/2019   Unilateral primary osteoarthritis, right hip 04/22/2019   Unilateral primary osteoarthritis, left hip 06/25/2018    Sharmon Leyden, PT, MPT 01/23/2021, 1:42 PM  [facility] Physical Therapy [address] [place], [place], [address] Phone: [phone]   Fax:  [phone]  Name: Jane Vasquez MRN: [id number] Date of Birth: Sep 02, 1945

## 2021-01-23 NOTE — Progress Notes (Signed)
The patient is now 6 weeks status post a right total knee arthroplasty.  She is making great progress with therapy and doing well.  She is been wearing TED hose.  Her family is with her today.  Her extension is almost full with her right operative knee and her flexion is well past 90 degrees.  I remove the remaining Steri-Strips.  Her calves are soft bilaterally.  She can continue to wear the TED hose if she is developing swelling in her feet and ankle.  We will transition her to tramadol and refill this.  She will continue therapy for another 2 weeks I believe and then transition to home exercise program.  I like to see her back in a month for repeat exam but no x-rays are needed.

## 2021-01-25 ENCOUNTER — Encounter: Payer: Self-pay | Admitting: Rehabilitative and Restorative Service Providers"

## 2021-01-25 ENCOUNTER — Telehealth: Payer: Self-pay | Admitting: Orthopaedic Surgery

## 2021-01-25 ENCOUNTER — Other Ambulatory Visit: Payer: Self-pay

## 2021-01-25 ENCOUNTER — Other Ambulatory Visit: Payer: Self-pay | Admitting: Orthopaedic Surgery

## 2021-01-25 ENCOUNTER — Ambulatory Visit (INDEPENDENT_AMBULATORY_CARE_PROVIDER_SITE_OTHER): Payer: Medicare Other | Admitting: Rehabilitative and Restorative Service Providers"

## 2021-01-25 DIAGNOSIS — R600 Localized edema: Secondary | ICD-10-CM

## 2021-01-25 DIAGNOSIS — M25561 Pain in right knee: Secondary | ICD-10-CM | POA: Diagnosis not present

## 2021-01-25 DIAGNOSIS — M25661 Stiffness of right knee, not elsewhere classified: Secondary | ICD-10-CM

## 2021-01-25 DIAGNOSIS — M6281 Muscle weakness (generalized): Secondary | ICD-10-CM

## 2021-01-25 DIAGNOSIS — R262 Difficulty in walking, not elsewhere classified: Secondary | ICD-10-CM

## 2021-01-25 MED ORDER — TRAMADOL HCL 50 MG PO TABS: 100.0000 mg | ORAL_TABLET | Freq: Four times a day (QID) | ORAL | 0 refills | Status: DC | PRN

## 2021-01-25 NOTE — Telephone Encounter (Signed)
Daughter aware.

## 2021-01-25 NOTE — Telephone Encounter (Signed)
Pt's daughter calling for a refill on her prescription of Tramadol. The best pharmacy is  [facility] [phone] - [place], [place]). The pts daughter requested a call back when its been sent to the pharmacy to be filled. The best phone number is [phone].

## 2021-01-25 NOTE — Therapy (Signed)
[facility] Physical Therapy [address] [place], [place], [address] Phone: [phone]   Fax:  [phone]  Physical Therapy Treatment  Patient Details  Name: Jane Vasquez MRN: [id number] Date of Birth: 02/17/1946 Referring Provider (PT): Gordy Savers MD   Encounter Date: 01/25/2021   PT End of Session - 01/25/21 1202     Visit Number 9    Number of Visits 24    Date for PT Re-Evaluation 03/23/21    Authorization Type Medicare    Progress Note Due on Visit 10    PT Start Time 1155   late for appointment   PT Stop Time 1225    PT Time Calculation (min) 30 min    Activity Tolerance Patient limited by pain   bilateral knee   Behavior During Therapy [facility] for tasks assessed/performed             Past Medical History:  Diagnosis Date   Gout    Gout    HTN (hypertension)    Osteoarthritis    left knee , right hip     Past Surgical History:  Procedure Laterality Date   APPENDECTOMY  age 63   JOINT REPLACEMENT     TOTAL HIP ARTHROPLASTY Right 04/23/2019   Procedure: RIGHT TOTAL HIP ARTHROPLASTY ANTERIOR APPROACH;  Surgeon: Kathryne Hitch, MD;  Location: [facility];  Service: Orthopedics;  Laterality: Right;   TOTAL KNEE ARTHROPLASTY Right 12/12/2020   Procedure: RIGHT TOTAL KNEE ARTHROPLASTY;  Surgeon: Kathryne Hitch, MD;  Location: [facility];  Service: Orthopedics;  Laterality: Right;    There were no vitals filed for this visit.   Subjective Assessment - 01/25/21 1200     Subjective Pt. indicated having soreness and pain in both knees in last few days.  Pt. indicated Rt knee about 4/10 today.  Noted Lt knee pain in walking and bending as well.    Pertinent History gout, HTN, s/p R THA 2020, and OA, right THA October 2020.    Limitations House hold activities;Lifting;Standing;Walking    Diagnostic tests X-ray    Patient Stated Goals To be able to walk with my knee straight and get my mobility back    Currently in Pain? Yes     Pain Score 4     Pain Location Knee    Pain Orientation Right   rated for Rt knee, Lt knee was present also   Pain Descriptors / Indicators Aching;Sore;Tightness    Pain Type Surgical pain    Pain Onset More than a month ago    Pain Frequency Intermittent    Aggravating Factors  bending, standing, walking    Pain Relieving Factors rest, ice                               [facility] Adult PT Treatment/Exercise - 01/25/21 0001       Exercises   Exercises Other Exercises    Other Exercises  Adjusted intervention today due to soreness/pain complaints bilateral      Knee/Hip Exercises: Aerobic   Nustep Lvl 5 10 mins UE/LE      Knee/Hip Exercises: Seated   Long Arc Quad Right   3 x 10   Long Arc Quad Weight 4 lbs.    Other Seated Knee/Hip Exercises seated SLR x 2 (unable to perform successfully)    Other Seated Knee/Hip Exercises seated quad set 5 sec hold x 12 (cues throughout for procedures)  Sit to [employer] Other (comment)   attempts at sit to stand from 18 inch, 20 inch chair unsuccessful s assistance from hands or Min A (due in part to bilateral knee impairments)                     PT Short Term Goals - 01/23/21 1340       PT SHORT TERM GOAL #1   Title Pt will be independent in her initial HEP.    Status On-going      PT SHORT TERM GOAL #2   Title Pt will be able to amb > 150 feet on level surfaces with straight cane safely.    Status On-going      PT SHORT TERM GOAL #3   Title Perform FOTO next visit and update predicted value in LTG.    Status Achieved               PT Long Term Goals - 01/23/21 1340       PT LONG TERM GOAL #1   Title Pt will be independent in advanced HEP.    Status On-going      PT LONG TERM GOAL #2   Title Pt will be able to perform 5 times sit to stand in </= 14 seconds using UE support    Status On-going      PT LONG TERM GOAL #3   Title Pt will improve her right knee AROM to >/= 120 degrees to improve  functional mobility.    Status On-going      PT LONG TERM GOAL #4   Title Pt will be able to amb with no device on community level surfaces safely for >/= 500 feet.    Status On-going      PT LONG TERM GOAL #5   Title Pt will improve her right knee strength to >/= 4+/5.    Status On-going      PT LONG TERM GOAL #6   Title pt will improve her FOTO score to predicted outcome.    Status On-going                   Plan - 01/25/21 1203     Clinical Impression Statement Adjustment of intervention today due to complaints of bilateral knee pains/soreness.  LImitations in Lt knee flexion also noted today.    Comorbidities S/p Right TKA  on 6/14.         PMH: gout, HTN, s/p R THA 2020, and OA.    Examination-Activity Limitations Bathing;Dressing;Squat;Stairs;Stand;Other;Sit;Sleep    Examination-Participation Restrictions Community Activity;Other;Church;Driving    Stability/Clinical Decision Making Stable/Uncomplicated    Rehab Potential Good    PT Frequency 2x / week    PT Duration 12 weeks    PT Treatment/Interventions ADLs/Self Care Home Management;Cryotherapy;Electrical Stimulation;DME Instruction;Gait training;Stair training;Functional mobility training;Therapeutic activities;Therapeutic exercise;Balance training;Patient/family education;Neuromuscular re-education;Manual techniques;Passive range of motion;Taping    PT Next Visit Plan Resume functional WB strengthening program for mobility, balance as symptoms allow.  (VASO not included in evaluation plan - see above).    PT Home Exercise Plan Access Code: [email or web address]  URL: [email or web address]  Date: 12/27/2020  Prepared by: Narda Amber    Exercises  Supine Heel Slide with Strap - 3 x daily - 7 x weekly - 2 sets - 10 reps - 10 seconds hold  Seated Knee Flexion Extension AROM - 3 x daily - 7 x weekly - 2 sets - 10 reps  Supine Short Arc Quad -  3 x daily - 7 x weekly - 2 sets - 10 reps  Seated Long Arc Quad - 3 x daily -  7 x weekly - 2 sets - 10 reps - 3-5 seconds hold  Seated Hamstring Stretch - 3 x daily - 7 x weekly - 5 reps - 30 seconds hold  Supine Active Straight Leg Raise - 3 x daily - 7 x weekly - 2 sets - 10 reps    Consulted and Agree with Plan of Care Patient             Patient will benefit from skilled therapeutic intervention in order to improve the following deficits and impairments:  Pain, Difficulty walking, Decreased range of motion, Decreased activity tolerance, Decreased balance, Impaired flexibility, Decreased mobility, Decreased strength, Increased edema  Visit Diagnosis: Muscle weakness (generalized)  Stiffness of right knee, not elsewhere classified  Acute pain of right knee  Localized edema  Difficulty in walking, not elsewhere classified     Problem List Patient Active Problem List   Diagnosis Date Noted   Arthritis of right knee 12/12/2020   Status post total right knee replacement 12/12/2020   Degenerative arthritis of knee, bilateral 06/07/2020   Status post total replacement of right hip 04/23/2019   Unilateral primary osteoarthritis, right hip 04/22/2019   Unilateral primary osteoarthritis, left hip 06/25/2018    Chyrel Masson, PT, DPT, OCS, ATC 01/25/21  12:19 PM    [facility] Physical Therapy [address] [place], [place], [address] Phone: [phone]   Fax:  [phone]  Name: YENG FRANKIE MRN: [id number] Date of Birth: Mar 20, 1946

## 2021-01-30 ENCOUNTER — Encounter: Payer: Self-pay | Admitting: Physical Therapy

## 2021-01-30 ENCOUNTER — Ambulatory Visit (INDEPENDENT_AMBULATORY_CARE_PROVIDER_SITE_OTHER): Payer: Medicare Other | Admitting: Physical Therapy

## 2021-01-30 ENCOUNTER — Other Ambulatory Visit: Payer: Self-pay

## 2021-01-30 DIAGNOSIS — M25561 Pain in right knee: Secondary | ICD-10-CM | POA: Diagnosis not present

## 2021-01-30 DIAGNOSIS — M25661 Stiffness of right knee, not elsewhere classified: Secondary | ICD-10-CM | POA: Diagnosis not present

## 2021-01-30 DIAGNOSIS — R600 Localized edema: Secondary | ICD-10-CM | POA: Diagnosis not present

## 2021-01-30 DIAGNOSIS — M6281 Muscle weakness (generalized): Secondary | ICD-10-CM | POA: Diagnosis not present

## 2021-01-30 DIAGNOSIS — R262 Difficulty in walking, not elsewhere classified: Secondary | ICD-10-CM

## 2021-01-30 NOTE — Therapy (Addendum)
[facility] Physical Therapy [address] [place], [place], [address] Phone: [phone]   Fax:  [phone]  Physical Therapy Treatment Progress Note  Patient Details  Name: Jane Vasquez MRN: [id number] Date of Birth: 1946-01-04 Referring Provider (PT): Gordy Savers MD  Progress Note Reporting Period 12/27/2020 to 01/30/2021 10th visit  See note below for Objective Data and Assessment of Progress/Goals.      Encounter Date: 01/30/2021   PT End of Session - 01/30/21 1207     Visit Number 10    Number of Visits 24    Date for PT Re-Evaluation 03/23/21    Authorization Type Medicare PN sent at 10th visit    Progress Note Due on Visit 20    PT Start Time 1145    PT Stop Time 1225    PT Time Calculation (min) 40 min    Equipment Utilized During Treatment Gait belt    Activity Tolerance Patient limited by pain    Behavior During Therapy WFL for tasks assessed/performed             Past Medical History:  Diagnosis Date   Gout    Gout    HTN (hypertension)    Osteoarthritis    left knee , right hip     Past Surgical History:  Procedure Laterality Date   APPENDECTOMY  age 39   JOINT REPLACEMENT     TOTAL HIP ARTHROPLASTY Right 04/23/2019   Procedure: RIGHT TOTAL HIP ARTHROPLASTY ANTERIOR APPROACH;  Surgeon: Kathryne Hitch, MD;  Location: [facility];  Service: Orthopedics;  Laterality: Right;   TOTAL KNEE ARTHROPLASTY Right 12/12/2020   Procedure: RIGHT TOTAL KNEE ARTHROPLASTY;  Surgeon: Kathryne Hitch, MD;  [facility];  Service: Orthopedics;  Laterality: Right;    There were no vitals filed for this visit.   Subjective Assessment - 01/30/21 1154     Subjective Pt arriving with daughter. Pt reproting pain of 3/10 in right anterior knee. Pt reproting compliance of her HEP.    Limitations House hold activities;Lifting;Standing;Walking    Patient Stated Goals To be able to walk with my knee straight and get my mobility  back    Currently in Pain? Yes    Pain Score 3     Pain Orientation Right    Pain Descriptors / Indicators Aching;Sore    Pain Type Surgical pain    Pain Onset More than a month ago                [facility] PT Assessment - 01/30/21 0001       Assessment   Medical Diagnosis Z96.651 s/p rigtht TKA    Referring Provider (PT) Gordy Savers MD    Onset Date/Surgical Date 12/12/20      AROM   Overall AROM Comments supine    Right/Left Knee Right    Right Knee Extension 5    Right Knee Flexion 94      PROM   Overall PROM Comments supine    Right/Left Knee Right    Right Knee Extension 0    Right Knee Flexion 98                           [facility] Adult PT Treatment/Exercise - 01/30/21 0001       Ambulation/Gait   Gait Comments amb with no device on level surfaces, pt instructed in posture correction and heel to toe.      Knee/Hip Exercises:  Machines for Strengthening   Total Gym Leg Press bilateral LE"s, 75# 3x10, R LE only: 37# x15      Knee/Hip Exercises: Standing   Forward Step Up Both;10 reps;Hand Hold: 1;Step Height: 6"    Other Standing Knee Exercises up and down 1 flight of stairs x 3 using right hand rail, progressing from step to pattern to step over step pattern with gait belt and CGA                      PT Short Term Goals - 01/30/21 1235       PT SHORT TERM GOAL #1   Title Pt will be independent in her initial HEP.    Status Achieved      PT SHORT TERM GOAL #2   Title Pt will be able to amb > 150 feet on level surfaces with straight cane safely.    Status Achieved      PT SHORT TERM GOAL #3   Title Perform FOTO next visit and update predicted value in LTG.    Status Achieved               PT Long Term Goals - 01/30/21 1236       PT LONG TERM GOAL #1   Title Pt will be independent in advanced HEP.    Status On-going      PT LONG TERM GOAL #2   Title Pt will be able to perform 5 times sit to stand in </=  14 seconds using UE support    Status On-going      PT LONG TERM GOAL #3   Title Pt will improve her right knee AROM to >/= 120 degrees to improve functional mobility.    Status On-going      PT LONG TERM GOAL #4   Title Pt will be able to amb with no device on community level surfaces safely for >/= 500 feet.    Status On-going      PT LONG TERM GOAL #5   Title Pt will improve her right knee strength to >/= 4+/5.    Status On-going      PT LONG TERM GOAL #6   Title pt will improve her FOTO score to predicted outcome.    Status On-going                   Plan - 01/30/21 1232     Clinical Impression Statement Pt arriving with daughter reporting 3/10 right knee pain. Pt tolerating exercises well, Stair navigation performed progressing from step to gait pattern to step through using single right hand rail. Pt making progress with amb without device and instructed to pt to begin amb around her home on level surfaces without her straight cane, but to continue to use it for communtiy amb and uneven surfaces. Continue skilled PT to maximize function.    Personal Factors and Comorbidities Comorbidity 3+    Comorbidities S/p Right TKA  on 6/14.         PMH: gout, HTN, s/p R THA 2020, and OA.    Examination-Activity Limitations Bathing;Dressing;Squat;Stairs;Stand;Other;Sit;Sleep    Examination-Participation Restrictions Community Activity;Other;Church;Driving    Stability/Clinical Decision Making Stable/Uncomplicated    Rehab Potential Good    PT Frequency 2x / week    PT Treatment/Interventions ADLs/Self Care Home Management;Cryotherapy;Electrical Stimulation;DME Instruction;Gait training;Stair training;Functional mobility training;Therapeutic activities;Therapeutic exercise;Balance training;Patient/family education;Neuromuscular re-education;Manual techniques;Passive range of motion;Taping    PT Next Visit Plan Resume  functional WB strengthening program, gait without device and stair  navigation progression, (VASO not included in evaluation plan - see above).    PT Home Exercise Plan Access Code: [email or web address]  URL: [email or web address]  Date: 12/27/2020  Prepared by: Narda Amber    Exercises  Supine Heel Slide with Strap - 3 x daily - 7 x weekly - 2 sets - 10 reps - 10 seconds hold  Seated Knee Flexion Extension AROM - 3 x daily - 7 x weekly - 2 sets - 10 reps  Supine Short Arc Quad - 3 x daily - 7 x weekly - 2 sets - 10 reps  Seated Long Arc Quad - 3 x daily - 7 x weekly - 2 sets - 10 reps - 3-5 seconds hold  Seated Hamstring Stretch - 3 x daily - 7 x weekly - 5 reps - 30 seconds hold  Supine Active Straight Leg Raise - 3 x daily - 7 x weekly - 2 sets - 10 reps    Consulted and Agree with Plan of Care Patient    Family Member Consulted daughter             Patient will benefit from skilled therapeutic intervention in order to improve the following deficits and impairments:  Pain, Difficulty walking, Decreased range of motion, Decreased activity tolerance, Decreased balance, Impaired flexibility, Decreased mobility, Decreased strength, Increased edema  Visit Diagnosis: Muscle weakness (generalized)  Stiffness of right knee, not elsewhere classified  Acute pain of right knee  Localized edema  Difficulty in walking, not elsewhere classified     Problem List Patient Active Problem List   Diagnosis Date Noted   Arthritis of right knee 12/12/2020   Status post total right knee replacement 12/12/2020   Degenerative arthritis of knee, bilateral 06/07/2020   Status post total replacement of right hip 04/23/2019   Unilateral primary osteoarthritis, right hip 04/22/2019   Unilateral primary osteoarthritis, left hip 06/25/2018    Sharmon Leyden, PT, MPT 01/30/2021, 12:38 PM  [facility] Physical Therapy [address] [place], [place], [address] Phone: [phone]   Fax:  [phone]  Name: XIMENA TODARO MRN: [id number] Date of  Birth: September 14, 1945

## 2021-02-01 ENCOUNTER — Encounter: Payer: Self-pay | Admitting: Rehabilitative and Restorative Service Providers"

## 2021-02-01 ENCOUNTER — Ambulatory Visit (INDEPENDENT_AMBULATORY_CARE_PROVIDER_SITE_OTHER): Payer: Medicare Other | Admitting: Rehabilitative and Restorative Service Providers"

## 2021-02-01 ENCOUNTER — Other Ambulatory Visit: Payer: Self-pay

## 2021-02-01 DIAGNOSIS — R600 Localized edema: Secondary | ICD-10-CM

## 2021-02-01 DIAGNOSIS — R262 Difficulty in walking, not elsewhere classified: Secondary | ICD-10-CM

## 2021-02-01 DIAGNOSIS — M6281 Muscle weakness (generalized): Secondary | ICD-10-CM | POA: Diagnosis not present

## 2021-02-01 DIAGNOSIS — M25561 Pain in right knee: Secondary | ICD-10-CM

## 2021-02-01 DIAGNOSIS — M25661 Stiffness of right knee, not elsewhere classified: Secondary | ICD-10-CM | POA: Diagnosis not present

## 2021-02-01 NOTE — Patient Instructions (Signed)
Access Code: [email or web address] URL: [email or web address] Date: 02/01/2021 Prepared by: Pauletta Browns  Exercises Supine Heel Slide with Strap - 3 x daily - 7 x weekly - 2 sets - 10 reps - 10 seconds hold Seated Knee Flexion Extension AROM - 3 x daily - 7 x weekly - 2 sets - 10 reps Supine Short Arc Quad - 3 x daily - 7 x weekly - 2 sets - 10 reps Seated Long Arc Quad - 3 x daily - 7 x weekly - 2 sets - 10 reps - 3-5 seconds hold Seated Hamstring Stretch - 3 x daily - 7 x weekly - 5 reps - 30 seconds hold Supine Active Straight Leg Raise - 3 x daily - 7 x weekly - 2 sets - 10 reps Supine Quadricep Sets - 2-3 x daily - 7 x weekly - 2-3 sets - 10 reps - 5 second hold Seated Knee Flexion AAROM - 3 x daily - 7 x weekly - 1 sets - 1 reps - 3 minutes hold Supine Bridge - 2-3 x daily - 7 x weekly - 2 sets - 10 reps Sit to Stand - 2-3 x daily - 7 x weekly - 2 sets - 10 reps

## 2021-02-01 NOTE — Therapy (Signed)
[facility] [address] [place], [place], [address] Phone: [phone]   Fax:  [phone]  Physical Therapy Treatment  Patient Details  Name: Jane Vasquez MRN: [id number] Date of Birth: 1945-12-09 Referring Provider (PT): Gordy Savers MD   Encounter Date: 02/01/2021   PT End of Session - 02/01/21 1328     Visit Number 11    Number of Visits 24    Date for PT Re-Evaluation 03/23/21    Authorization Type Medicare PN sent at 10th visit    Progress Note Due on Visit 20    PT Start Time 1104    PT Stop Time 1145    PT Time Calculation (min) 41 min    Activity Tolerance Patient tolerated treatment well;Patient limited by fatigue    Behavior During Therapy [facility] for tasks assessed/performed             Past Medical History:  Diagnosis Date   Gout    Gout    HTN (hypertension)    Osteoarthritis    left knee , right hip     Past Surgical History:  Procedure Laterality Date   APPENDECTOMY  age 45   JOINT REPLACEMENT     TOTAL HIP ARTHROPLASTY Right 04/23/2019   Procedure: RIGHT TOTAL HIP ARTHROPLASTY ANTERIOR APPROACH;  Surgeon: Kathryne Hitch, MD;  Location: [facility];  Service: Orthopedics;  Laterality: Right;   TOTAL KNEE ARTHROPLASTY Right 12/12/2020   Procedure: RIGHT TOTAL KNEE ARTHROPLASTY;  Surgeon: Kathryne Hitch, MD;  Location: [facility];  Service: Orthopedics;  Laterality: Right;    There were no vitals filed for this visit.   Subjective Assessment - 02/01/21 1115     Subjective Jane Vasquez notes sleeping well.  She is using pain medication after PT and occasionally at night.  She is excited to be returning home after following-up with Dr. Magnus Ivan 02/22/2021.    Patient is accompained by: Family member    Pertinent History gout, HTN, s/p R THA 2020, and OA, right THA October 2020.    Limitations House hold activities;Lifting;Standing;Walking    How long can you sit comfortably? Was able to do a 2:40 car ride  (each way) with no problems to her home    How long can you stand comfortably? 30-60 minutes    How long can you walk comfortably? 5 minutes    Patient Stated Goals To be able to walk with my knee straight and get my mobility back    Currently in Pain? Yes    Pain Score 3     Pain Location Knee    Pain Orientation Right    Pain Descriptors / Indicators Aching;Sore;Tightness    Pain Type Surgical pain    Pain Radiating Towards NA    Pain Onset More than a month ago    Pain Frequency Constant    Aggravating Factors  End range flexion AROM and too much WB    Pain Relieving Factors Rest, ice and pain medications    Effect of Pain on Daily Activities Uses a cane outside the house, not living independently yet    Multiple Pain Sites No                [facility] PT Assessment - 02/01/21 0001       AROM   Overall AROM Comments Supine    Right/Left Knee Right    Right Knee Extension -3    Right Knee Flexion 96  [facility] Adult PT Treatment/Exercise - 02/01/21 0001       Exercises   Exercises Knee/Hip      Knee/Hip Exercises: Aerobic   Nustep Level 5 for 8 minutes      Knee/Hip Exercises: Machines for Strengthening   Total Gym Leg Press 75# 2 sets of 15 with slow eccentrics push into extension and flexion hang for 5 seconds; single leg R only 1 set of 15 with 37# same protocol      Knee/Hip Exercises: Seated   Other Seated Knee/Hip Exercises Tailgate knee flexion AROM 2 minutes    Other Seated Knee/Hip Exercises AAROM L pushes R into flexion 2 sets of 10 for 10 seconds      Knee/Hip Exercises: Supine   Quad Sets Strengthening;Both;2 sets;10 reps;Limitations    Quad Sets Limitations 5 seconds (toes back, press knees down and tighten thighs)                    PT Education - 02/01/21 1326     Education Details Reviewed HEP with heavy emphasis on 100 quadriceps sets/day (edema control, knee extension AROM and strength) and 2  knee flexion AROM activities.    Person(s) Educated Patient;Spouse    Methods Explanation;Demonstration;Tactile cues;Verbal cues;Handout    Comprehension Verbalized understanding;Tactile cues required;Need further instruction;Returned demonstration;Verbal cues required              PT Short Term Goals - 01/30/21 1235       PT SHORT TERM GOAL #1   Title Pt will be independent in her initial HEP.    Status Achieved      PT SHORT TERM GOAL #2   Title Pt will be able to amb > 150 feet on level surfaces with straight cane safely.    Status Achieved      PT SHORT TERM GOAL #3   Title Perform FOTO next visit and update predicted value in LTG.    Status Achieved               PT Long Term Goals - 01/30/21 1236       PT LONG TERM GOAL #1   Title Pt will be independent in advanced HEP.    Status On-going      PT LONG TERM GOAL #2   Title Pt will be able to perform 5 times sit to stand in </= 14 seconds using UE support    Status On-going      PT LONG TERM GOAL #3   Title Pt will improve her right knee AROM to >/= 120 degrees to improve functional mobility.    Status On-going      PT LONG TERM GOAL #4   Title Pt will be able to amb with no device on community level surfaces safely for >/= 500 feet.    Status On-going      PT LONG TERM GOAL #5   Title Pt will improve her right knee strength to >/= 4+/5.    Status On-going      PT LONG TERM GOAL #6   Title pt will improve her FOTO score to predicted outcome.    Status On-going                   Plan - 02/01/21 1328     Clinical Impression Statement Jane Vasquez continues to make progress with her flexion and extension AROM, even as compared to 2 days ago.  We discussed the importance of consistent 2+  time/day HEP compliance to return quadriceps strength, wean from the cane, control edema and reduce pain.  Continue POC with emphasis on returning AROM (0-105), strength, gait and self-reported function to meet  long-term goals.    Personal Factors and Comorbidities Comorbidity 3+    Comorbidities S/p Right TKA  on 6/14.         PMH: gout, HTN, s/p R THA 2020, and OA.    Examination-Activity Limitations Bathing;Dressing;Squat;Stairs;Stand;Other;Sit;Sleep    Examination-Participation Restrictions Community Activity;Other;Church;Driving    Stability/Clinical Decision Making Stable/Uncomplicated    Rehab Potential Good    PT Frequency 2x / week    PT Duration 8 weeks    PT Treatment/Interventions ADLs/Self Care Home Management;Cryotherapy;Electrical Stimulation;DME Instruction;Gait training;Stair training;Functional mobility training;Therapeutic activities;Therapeutic exercise;Balance training;Patient/family education;Neuromuscular re-education;Manual techniques;Passive range of motion;Taping    PT Next Visit Plan FOTO, knee AROM, strength and gait/ADL specific activities PRN    PT Home Exercise Plan Access Code: [email or web address]  URL: [email or web address]  Date: 12/27/2020  Prepared by: Narda Amber    Exercises  Supine Heel Slide with Strap - 3 x daily - 7 x weekly - 2 sets - 10 reps - 10 seconds hold  Seated Knee Flexion Extension AROM - 3 x daily - 7 x weekly - 2 sets - 10 reps  Supine Short Arc Quad - 3 x daily - 7 x weekly - 2 sets - 10 reps  Seated Long Arc Quad - 3 x daily - 7 x weekly - 2 sets - 10 reps - 3-5 seconds hold  Seated Hamstring Stretch - 3 x daily - 7 x weekly - 5 reps - 30 seconds hold  Supine Active Straight Leg Raise - 3 x daily - 7 x weekly - 2 sets - 10 reps    Consulted and Agree with Plan of Care Patient    Family Member Consulted --             Patient will benefit from skilled therapeutic intervention in order to improve the following deficits and impairments:  Pain, Difficulty walking, Decreased range of motion, Decreased activity tolerance, Decreased balance, Impaired flexibility, Decreased mobility, Decreased strength, Increased edema  Visit  Diagnosis: Difficulty walking  Muscle weakness (generalized)  Stiffness of right knee, not elsewhere classified  Acute pain of right knee  Localized edema     Problem List Patient Active Problem List   Diagnosis Date Noted   Arthritis of right knee 12/12/2020   Status post total right knee replacement 12/12/2020   Degenerative arthritis of knee, bilateral 06/07/2020   Status post total replacement of right hip 04/23/2019   Unilateral primary osteoarthritis, right hip 04/22/2019   Unilateral primary osteoarthritis, left hip 06/25/2018    Cherlyn Cushing PT, MPT 02/01/2021, 1:34 PM  [facility] Therapy [address] [place], [place], [address] Phone: [phone]   Fax:  [phone]  Name: Jane Vasquez MRN: [id number] Date of Birth: 05/30/1946

## 2021-02-05 ENCOUNTER — Other Ambulatory Visit: Payer: Self-pay | Admitting: Orthopaedic Surgery

## 2021-02-05 ENCOUNTER — Other Ambulatory Visit: Payer: Self-pay

## 2021-02-05 ENCOUNTER — Ambulatory Visit (INDEPENDENT_AMBULATORY_CARE_PROVIDER_SITE_OTHER): Payer: Medicare Other | Admitting: Physical Therapy

## 2021-02-05 ENCOUNTER — Telehealth: Payer: Self-pay | Admitting: Orthopaedic Surgery

## 2021-02-05 DIAGNOSIS — R262 Difficulty in walking, not elsewhere classified: Secondary | ICD-10-CM

## 2021-02-05 DIAGNOSIS — M25561 Pain in right knee: Secondary | ICD-10-CM | POA: Diagnosis not present

## 2021-02-05 DIAGNOSIS — M6281 Muscle weakness (generalized): Secondary | ICD-10-CM

## 2021-02-05 DIAGNOSIS — M25661 Stiffness of right knee, not elsewhere classified: Secondary | ICD-10-CM

## 2021-02-05 DIAGNOSIS — R600 Localized edema: Secondary | ICD-10-CM

## 2021-02-05 MED ORDER — TRAMADOL HCL 50 MG PO TABS: 100.0000 mg | ORAL_TABLET | Freq: Four times a day (QID) | ORAL | 0 refills | Status: DC | PRN

## 2021-02-05 NOTE — Telephone Encounter (Signed)
Daughter aware Rx sent to pharmacy

## 2021-02-05 NOTE — Therapy (Signed)
[facility] Physical Therapy [address] [place], [place], [address] Phone: [phone]   Fax:  [phone]  Physical Therapy Treatment  Patient Details  Name: Jane Vasquez MRN: [id number] Date of Birth: 1946-05-09 Referring Provider (PT): Doneen Poisson MD   Encounter Date: 02/05/2021   PT End of Session - 02/05/21 1110     Visit Number 12    Number of Visits 24    Date for PT Re-Evaluation 03/23/21    Authorization Type Medicare PN sent at 10th visit    Progress Note Due on Visit 20    PT Start Time 1105    PT Stop Time 1150    PT Time Calculation (min) 45 min    Activity Tolerance Patient tolerated treatment well;Patient limited by fatigue    Behavior During Therapy [facility] for tasks assessed/performed             Past Medical History:  Diagnosis Date   Gout    Gout    HTN (hypertension)    Osteoarthritis    left knee , right hip     Past Surgical History:  Procedure Laterality Date   APPENDECTOMY  age 39   JOINT REPLACEMENT     TOTAL HIP ARTHROPLASTY Right 04/23/2019   Procedure: RIGHT TOTAL HIP ARTHROPLASTY ANTERIOR APPROACH;  Surgeon: Kathryne Hitch, MD;  Location: [facility];  Service: Orthopedics;  Laterality: Right;   TOTAL KNEE ARTHROPLASTY Right 12/12/2020   Procedure: RIGHT TOTAL KNEE ARTHROPLASTY;  Surgeon: Kathryne Hitch, MD;  Location: [facility];  Service: Orthopedics;  Laterality: Right;    There were no vitals filed for this visit.   Subjective Assessment - 02/05/21 1108     Subjective Pt arriving today reporting 3/10 pain in right knee.    Pertinent History gout, HTN, s/p R THA 2020, and OA, right THA October 2020.    Limitations House hold activities;Lifting;Standing;Walking    Patient Stated Goals To be able to walk with my knee straight and get my mobility back    Currently in Pain? Yes    Pain Score 3     Pain Location Knee    Pain Orientation Right    Pain Descriptors / Indicators Aching;Sore;Tightness     Pain Type Surgical pain    Pain Onset More than a month ago                [facility] PT Assessment - 02/05/21 0001       Assessment   Medical Diagnosis Z96.651 s/p right TKA    Referring Provider (PT) Doneen Poisson MD    Onset Date/Surgical Date 12/12/20      Observation/Other Assessments   Focus on Therapeutic Outcomes (FOTO)  63% (predicted 65%)      AROM   Overall AROM Comments supine    Right/Left Knee Right    Right Knee Extension 3    Right Knee Flexion 96      PROM   Overall PROM Comments supine    Right/Left Knee Right    Right Knee Extension 0    Right Knee Flexion 100                           [facility] Adult PT Treatment/Exercise - 02/05/21 0001       Therapeutic Activites    Other Therapeutic Activities up and down clinic stairs with one hand rail x 5, pt able to ascend steps with step over step and  dscends steps stepping down with with her right LE first with step to gait pattern. Close supervision to CGA for safety.      Exercises   Exercises Knee/Hip      Knee/Hip Exercises: Aerobic   Nustep Level 6 for 8 minutes      Knee/Hip Exercises: Machines for Strengthening   Total Gym Leg Press 75# 2x10 bilateral LE"s with sled in lowest position, pt instructed in slow eccentrics, Right LE only 37# 2x10      Knee/Hip Exercises: Standing   Step Down Limitations right LE on 6 inch step, stepping up and lowering down 2x10 with UE support    Rocker Board 1 minute   x2 with UE support   SLS 3x 20 seconds with intermittent UE support and close supervision      Knee/Hip Exercises: Seated   Other Seated Knee/Hip Exercises seated SLR 2x10    Other Seated Knee/Hip Exercises seated QS with foot propped on stool holding 5 seconds x 15                      PT Short Term Goals - 02/05/21 1155       PT SHORT TERM GOAL #1   Title Pt will be independent in her initial HEP.    Time 3    Period Weeks    Status Achieved      PT  SHORT TERM GOAL #2   Title Pt will be able to amb > 150 feet on level surfaces with straight cane safely.    Status Achieved      PT SHORT TERM GOAL #3   Title Perform FOTO next visit and update predicted value in LTG.    Status Achieved               PT Long Term Goals - 02/05/21 1155       PT LONG TERM GOAL #1   Title Pt will be independent in advanced HEP.    Status On-going      PT LONG TERM GOAL #2   Title Pt will be able to perform 5 times sit to stand in </= 14 seconds using UE support    Status On-going      PT LONG TERM GOAL #3   Title Pt will improve her right knee AROM to >/= 120 degrees to improve functional mobility.    Status On-going      PT LONG TERM GOAL #4   Title Pt will be able to amb with no device on community level surfaces safely for >/= 500 feet.    Baseline amb at home and short communtiy distances with no device, amb community surfaces with straight cane.    Status On-going      PT LONG TERM GOAL #5   Title Pt will improve her right knee strength to >/= 4+/5.    Status On-going      PT LONG TERM GOAL #6   Title pt will improve her FOTO score to predicted outcome.    Baseline 51 (Goal 65)    Status On-going                   Plan - 02/05/21 1134     Clinical Impression Statement Pt is making progress with amb without her assistive device on level surfaces. Pt still using her cane intermittently and on community surfaces. Pt still progressing with rigth quad strength to improve stair navigatio and overall functional  mobility. Adding more single level balance exercises into pt's session.Pt has improved her FOTO score to 63% from 52%. Still progressing toward her LTG's.  Continue skilled PT to maximize function.    Personal Factors and Comorbidities Comorbidity 3+    Comorbidities S/p Right TKA  on 6/14.         PMH: gout, HTN, s/p R THA 2020, and OA.    Examination-Activity Limitations  Bathing;Dressing;Squat;Stairs;Stand;Other;Sit;Sleep    Examination-Participation Restrictions Community Activity;Other;Church;Driving    Stability/Clinical Decision Making Stable/Uncomplicated    Rehab Potential Good    PT Frequency 2x / week    PT Duration 8 weeks    PT Treatment/Interventions ADLs/Self Care Home Management;Cryotherapy;Electrical Stimulation;DME Instruction;Gait training;Stair training;Functional mobility training;Therapeutic activities;Therapeutic exercise;Balance training;Patient/family education;Neuromuscular re-education;Manual techniques;Passive range of motion;Taping    PT Next Visit Plan knee AROM, strength and gait/ADL activities as needed    PT Home Exercise Plan Access Code: [email or web address]  URL: [email or web address]  Date: 12/27/2020  Prepared by: Narda Amber    Exercises  Supine Heel Slide with Strap - 3 x daily - 7 x weekly - 2 sets - 10 reps - 10 seconds hold  Seated Knee Flexion Extension AROM - 3 x daily - 7 x weekly - 2 sets - 10 reps  Supine Short Arc Quad - 3 x daily - 7 x weekly - 2 sets - 10 reps  Seated Long Arc Quad - 3 x daily - 7 x weekly - 2 sets - 10 reps - 3-5 seconds hold  Seated Hamstring Stretch - 3 x daily - 7 x weekly - 5 reps - 30 seconds hold  Supine Active Straight Leg Raise - 3 x daily - 7 x weekly - 2 sets - 10 reps    Consulted and Agree with Plan of Care Patient    Family Member Consulted husband             Patient will benefit from skilled therapeutic intervention in order to improve the following deficits and impairments:  Pain, Difficulty walking, Decreased range of motion, Decreased activity tolerance, Decreased balance, Impaired flexibility, Decreased mobility, Decreased strength, Increased edema  Visit Diagnosis: Difficulty walking  Muscle weakness (generalized)  Stiffness of right knee, not elsewhere classified  Acute pain of right knee  Localized edema  Difficulty in walking, not elsewhere  classified     Problem List Patient Active Problem List   Diagnosis Date Noted   Arthritis of right knee 12/12/2020   Status post total right knee replacement 12/12/2020   Degenerative arthritis of knee, bilateral 06/07/2020   Status post total replacement of right hip 04/23/2019   Unilateral primary osteoarthritis, right hip 04/22/2019   Unilateral primary osteoarthritis, left hip 06/25/2018    Sharmon Leyden, PT, MPT 02/05/2021, 12:01 PM  [facility] Physical Therapy [address] [place], [place], [address] Phone: [phone]   Fax:  [phone]  Name: Jane Vasquez MRN: [id number] Date of Birth: 1945-08-07

## 2021-02-05 NOTE — Telephone Encounter (Signed)
Pt's daughter called wanting a refill on her mother's tramadol prescription. The best pharmacy is the [facility] on [address] (this is the new one and has been updated in the system as the main pharmacy). Pt's daughter asked for a call back once its been sent in so they may go pick it up, the best call back number is [phone].

## 2021-02-07 ENCOUNTER — Other Ambulatory Visit: Payer: Self-pay

## 2021-02-07 ENCOUNTER — Ambulatory Visit (INDEPENDENT_AMBULATORY_CARE_PROVIDER_SITE_OTHER): Payer: Medicare Other | Admitting: Rehabilitative and Restorative Service Providers"

## 2021-02-07 ENCOUNTER — Encounter: Payer: Self-pay | Admitting: Rehabilitative and Restorative Service Providers"

## 2021-02-07 DIAGNOSIS — R600 Localized edema: Secondary | ICD-10-CM | POA: Diagnosis not present

## 2021-02-07 DIAGNOSIS — M6281 Muscle weakness (generalized): Secondary | ICD-10-CM | POA: Diagnosis not present

## 2021-02-07 DIAGNOSIS — R262 Difficulty in walking, not elsewhere classified: Secondary | ICD-10-CM

## 2021-02-07 DIAGNOSIS — M25661 Stiffness of right knee, not elsewhere classified: Secondary | ICD-10-CM

## 2021-02-07 DIAGNOSIS — M25561 Pain in right knee: Secondary | ICD-10-CM | POA: Diagnosis not present

## 2021-02-07 NOTE — Therapy (Addendum)
[facility] [address] [place], [place], [address] Phone: [phone]   Fax:  [phone]  Physical Therapy Treatment /Discharge  Patient Details  Name: Jane Vasquez MRN: [id number] Date of Birth: 1945/08/13 Referring Provider (PT): Jean Rosenthal MD   Encounter Date: 02/07/2021   PT End of Session - 02/07/21 1055     Visit Number 13    Number of Visits 24    Date for PT Re-Evaluation 03/23/21    Authorization Type Medicare KX 15    Progress Note Due on Visit 84    PT Start Time 1056    PT Stop Time 1135    PT Time Calculation (min) 39 min    Activity Tolerance Patient limited by fatigue;Patient limited by pain   Lt leg pain limitations   Behavior During Therapy [facility] for tasks assessed/performed             Past Medical History:  Diagnosis Date   Gout    Gout    HTN (hypertension)    Osteoarthritis    left knee , right hip     Past Surgical History:  Procedure Laterality Date   APPENDECTOMY  age 26   JOINT REPLACEMENT     TOTAL HIP ARTHROPLASTY Right 04/23/2019   Procedure: RIGHT TOTAL HIP ARTHROPLASTY ANTERIOR APPROACH;  Surgeon: Mcarthur Rossetti, MD;  Location: [facility];  Service: Orthopedics;  Laterality: Right;   TOTAL KNEE ARTHROPLASTY Right 12/12/2020   Procedure: RIGHT TOTAL KNEE ARTHROPLASTY;  Surgeon: Mcarthur Rossetti, MD;  Location: [facility];  Service: Orthopedics;  Laterality: Right;    There were no vitals filed for this visit.   Subjective Assessment - 02/07/21 1101     Subjective Pt. stated feeling no specific pain in knee at rest.  Pt. indicated she does feel sore in muscles in legs after workouts.    Pertinent History gout, HTN, s/p R THA 2020, and OA, right THA October 2020.    Limitations House hold activities;Lifting;Standing;Walking    Patient Stated Goals To be able to walk with my knee straight and get my mobility back    Currently in Pain? No/denies    Pain Score 0-No pain    Pain  Onset More than a month ago                               [facility] Adult PT Treatment/Exercise - 02/07/21 0001       Neuro Re-ed    Neuro Re-ed Details  tandem stance 1 min x 2 bilateral, retro step Rt leg posterior x 20   SBA by clinician     Knee/Hip Exercises: Aerobic   Nustep Lvl 6 10 mins UE/LE      Knee/Hip Exercises: Machines for Strengthening   Cybex Knee Extension 5 lbs x 5 double leg (limited by pain on Lt knee)    Total Gym Leg Press Rt leg only 3 x 10 37 lbs      Knee/Hip Exercises: Seated   Long Arc Quad Right   3 x 10   Long Arc Quad Weight 4 lbs.    Sit to [employer] Other (comment);without UE support   23 inch table                     PT Short Term Goals - 02/05/21 1155       PT SHORT TERM GOAL #1   Title Pt  will be independent in her initial HEP.    Time 3    Period Weeks    Status Achieved      PT SHORT TERM GOAL #2   Title Pt will be able to amb > 150 feet on level surfaces with straight cane safely.    Status Achieved      PT SHORT TERM GOAL #3   Title Perform FOTO next visit and update predicted value in LTG.    Status Achieved               PT Long Term Goals - 02/05/21 1155       PT LONG TERM GOAL #1   Title Pt will be independent in advanced HEP.    Status On-going      PT LONG TERM GOAL #2   Title Pt will be able to perform 5 times sit to stand in </= 14 seconds using UE support    Status On-going      PT LONG TERM GOAL #3   Title Pt will improve her right knee AROM to >/= 120 degrees to improve functional mobility.    Status On-going      PT LONG TERM GOAL #4   Title Pt will be able to amb with no device on community level surfaces safely for >/= 500 feet.    Baseline amb at home and short communtiy distances with no device, amb community surfaces with straight cane.    Status On-going      PT LONG TERM GOAL #5   Title Pt will improve her right knee strength to >/= 4+/5.    Status On-going       PT LONG TERM GOAL #6   Title pt will improve her FOTO score to predicted outcome.    Baseline 51 (Goal 65)    Status On-going                   Plan - 02/07/21 1103     Clinical Impression Statement Adjusted intervention today to include increased focus on balance improvements to help transitioning from [facility].  Lt leg showing more difficulty c WB activity vs. Rt today.    Personal Factors and Comorbidities Comorbidity 3+    Comorbidities S/p Right TKA  on 6/14.         PMH: gout, HTN, s/p R THA 2020, and OA.    Examination-Activity Limitations Bathing;Dressing;Squat;Stairs;Stand;Other;Sit;Sleep    Examination-Participation Restrictions Community Activity;Other;Church;Driving    Stability/Clinical Decision Making Stable/Uncomplicated    Rehab Potential Good    PT Frequency 2x / week    PT Duration 8 weeks    PT Treatment/Interventions ADLs/Self Care Home Management;Cryotherapy;Electrical Stimulation;DME Instruction;Gait training;Stair training;Functional mobility training;Therapeutic activities;Therapeutic exercise;Balance training;Patient/family education;Neuromuscular re-education;Manual techniques;Passive range of motion;Taping    PT Next Visit Plan Continue strengthening, functional mobility and balance.    PT Home Exercise Plan Access Code: [email or web address]  URL: [email or web address]  Date: 12/27/2020  Prepared by: Kearney Hard    Exercises  Supine Heel Slide with Strap - 3 x daily - 7 x weekly - 2 sets - 10 reps - 10 seconds hold  Seated Knee Flexion Extension AROM - 3 x daily - 7 x weekly - 2 sets - 10 reps  Supine Short Arc Quad - 3 x daily - 7 x weekly - 2 sets - 10 reps  Seated Long Arc Quad - 3 x daily - 7 x weekly - 2 sets - 10 reps - 3-5 seconds hold  Seated Hamstring Stretch - 3 x daily - 7 x weekly - 5 reps - 30 seconds hold  Supine Active Straight Leg Raise - 3 x daily - 7 x weekly - 2 sets - 10 reps    Consulted and Agree with Plan of Care Patient    Family  Member Consulted --             Patient will benefit from skilled therapeutic intervention in order to improve the following deficits and impairments:  Pain, Difficulty walking, Decreased range of motion, Decreased activity tolerance, Decreased balance, Impaired flexibility, Decreased mobility, Decreased strength, Increased edema  Visit Diagnosis: Muscle weakness (generalized)  Stiffness of right knee, not elsewhere classified  Acute pain of right knee  Localized edema  Difficulty in walking, not elsewhere classified     Problem List Patient Active Problem List   Diagnosis Date Noted   Arthritis of right knee 12/12/2020   Status post total right knee replacement 12/12/2020   Degenerative arthritis of knee, bilateral 06/07/2020   Status post total replacement of right hip 04/23/2019   Unilateral primary osteoarthritis, right hip 04/22/2019   Unilateral primary osteoarthritis, left hip 06/25/2018    Scot Jun, PT, DPT, OCS, ATC 02/07/21  12:22 PM  PHYSICAL THERAPY DISCHARGE SUMMARY  Visits from Start of Care: 13  Current functional level related to goals / functional outcomes: See note   Remaining deficits: See note   Education / Equipment: HEP   Patient agrees to discharge. Patient goals were partially met. Patient is being discharged due to not returning since the last visit.  Scot Jun, PT, DPT, OCS, ATC 03/19/21  2:48 PM     [facility] [address] [place], [place], [address] Phone: [phone]   Fax:  [phone]  Name: AAIRAH NEGRETTE MRN: [id number] Date of Birth: March 24, 1946

## 2021-02-12 ENCOUNTER — Other Ambulatory Visit: Payer: Self-pay | Admitting: Orthopaedic Surgery

## 2021-02-12 ENCOUNTER — Telehealth: Payer: Self-pay | Admitting: Orthopaedic Surgery

## 2021-02-12 MED ORDER — HYDROCODONE-ACETAMINOPHEN 5-325 MG PO TABS: 1.0000 | ORAL_TABLET | Freq: Four times a day (QID) | ORAL | 0 refills | Status: DC | PRN

## 2021-02-12 NOTE — Telephone Encounter (Signed)
Patient called asked if she can get her Rx changed from Tramadol to Hydrocodone?  Patient said the Tramadol is not helping control the pain she is having. The number to contact patient is [phone]

## 2021-02-12 NOTE — Telephone Encounter (Signed)
Lvm for the pt to cb to inform

## 2021-02-12 NOTE — Telephone Encounter (Signed)
Please advise 

## 2021-02-13 ENCOUNTER — Encounter: Payer: Medicare Other | Admitting: Physical Therapy

## 2021-02-13 ENCOUNTER — Telehealth: Payer: Self-pay | Admitting: Physical Therapy

## 2021-02-13 NOTE — Telephone Encounter (Signed)
I called pt to remind her she had no physical therapy appointments scheduled and she needs to call after she arranges transportation with her daughter.  Narda Amber, PT, MPT 02/13/21 11:22 AM

## 2021-02-15 ENCOUNTER — Other Ambulatory Visit: Payer: Self-pay | Admitting: Physician Assistant

## 2021-02-15 ENCOUNTER — Telehealth: Payer: Self-pay | Admitting: Orthopaedic Surgery

## 2021-02-15 MED ORDER — TRAMADOL HCL 50 MG PO TABS: 100.0000 mg | ORAL_TABLET | Freq: Four times a day (QID) | ORAL | 0 refills | Status: DC | PRN

## 2021-02-15 NOTE — Telephone Encounter (Signed)
Pt called requesting a refill of tramadol. Please send to pharmacy on file. Pt asked to be called when medication has been sent in. Pt phone number is [phone]

## 2021-02-15 NOTE — Telephone Encounter (Signed)
Please advise 

## 2021-02-22 ENCOUNTER — Encounter: Payer: Medicare Other | Admitting: Orthopaedic Surgery

## 2021-02-23 ENCOUNTER — Telehealth: Payer: Self-pay | Admitting: Orthopaedic Surgery

## 2021-02-23 ENCOUNTER — Other Ambulatory Visit: Payer: Self-pay | Admitting: Orthopaedic Surgery

## 2021-02-23 MED ORDER — TRAMADOL HCL 50 MG PO TABS: 100.0000 mg | ORAL_TABLET | Freq: Four times a day (QID) | ORAL | 0 refills | Status: DC | PRN

## 2021-02-23 NOTE — Telephone Encounter (Signed)
Please advise 

## 2021-02-23 NOTE — Telephone Encounter (Signed)
Patient called needing Rx refilled Tramadol. The number to contact patient is [phone]

## 2021-02-28 ENCOUNTER — Encounter: Payer: Self-pay | Admitting: Orthopaedic Surgery

## 2021-02-28 ENCOUNTER — Other Ambulatory Visit: Payer: Self-pay

## 2021-02-28 ENCOUNTER — Ambulatory Visit (INDEPENDENT_AMBULATORY_CARE_PROVIDER_SITE_OTHER): Payer: Medicare Other | Admitting: Orthopaedic Surgery

## 2021-02-28 DIAGNOSIS — Z96651 Presence of right artificial knee joint: Secondary | ICD-10-CM

## 2021-02-28 MED ORDER — TRAMADOL HCL 50 MG PO TABS: 100.0000 mg | ORAL_TABLET | Freq: Four times a day (QID) | ORAL | 0 refills | Status: DC | PRN

## 2021-02-28 NOTE — Progress Notes (Signed)
The patient comes in today now 11 weeks status post a right total knee arthroplasty.  She is ambulate with a cane.  She reports some pain but doing well overall.  She does request a refill of tramadol.  She does report that she is getting better overall as each week goes by.  Her right operative knee looks good.  There is still some swelling to be expected.  The incision looks good.  Her extension is full and her flexion is full.  The knee feels stable on exam.  She will continue to increase her activities as comfort allows.  I did send in some more tramadol for her.  The next time I need to see her back 3 months from now.  We will have an AP and lateral of her right knee at that visit.

## 2021-03-01 ENCOUNTER — Encounter: Payer: Medicare Other | Admitting: Orthopaedic Surgery

## 2021-03-02 ENCOUNTER — Other Ambulatory Visit: Payer: Self-pay | Admitting: Orthopaedic Surgery

## 2021-03-02 ENCOUNTER — Telehealth: Payer: Self-pay | Admitting: Orthopaedic Surgery

## 2021-03-02 MED ORDER — TRAMADOL HCL 50 MG PO TABS: 100.0000 mg | ORAL_TABLET | Freq: Four times a day (QID) | ORAL | 0 refills | Status: DC | PRN

## 2021-03-02 NOTE — Telephone Encounter (Signed)
Patient called. She would like a refill on Tr[facility] [place]. Her call back number is [phone]

## 2021-03-02 NOTE — Telephone Encounter (Signed)
Please advise 

## 2021-03-09 ENCOUNTER — Other Ambulatory Visit: Payer: Self-pay | Admitting: Orthopaedic Surgery

## 2021-03-09 ENCOUNTER — Telehealth: Payer: Self-pay | Admitting: Orthopaedic Surgery

## 2021-03-09 MED ORDER — TRAMADOL HCL 50 MG PO TABS: 100.0000 mg | ORAL_TABLET | Freq: Four times a day (QID) | ORAL | 0 refills | Status: DC | PRN

## 2021-03-09 NOTE — Telephone Encounter (Signed)
Pt calling asking for a refill on her tramadol prescription. [facility] [place], [place]. The best call back number if needed is [phone].

## 2021-03-14 ENCOUNTER — Telehealth: Payer: Self-pay | Admitting: Orthopaedic Surgery

## 2021-03-14 ENCOUNTER — Other Ambulatory Visit: Payer: Self-pay | Admitting: Orthopaedic Surgery

## 2021-03-14 MED ORDER — TRAMADOL HCL 50 MG PO TABS: 100.0000 mg | ORAL_TABLET | Freq: Four times a day (QID) | ORAL | 0 refills | Status: DC | PRN

## 2021-03-14 NOTE — Telephone Encounter (Signed)
Please advise 

## 2021-03-14 NOTE — Telephone Encounter (Signed)
Pt would like to know if she can get 10 tramadol called in because she is going out of town.   CB [phone]

## 2021-03-15 ENCOUNTER — Telehealth: Payer: Self-pay | Admitting: Orthopaedic Surgery

## 2021-03-15 NOTE — Telephone Encounter (Signed)
Lvm informing pt.

## 2021-03-15 NOTE — Telephone Encounter (Signed)
Please advise 

## 2021-03-15 NOTE — Telephone Encounter (Signed)
Patient called advised she is going out of town tomorrow and would like to get another Rx of Tramadol called into her pharmacy. The number to contact patient is [phone]

## 2021-03-16 ENCOUNTER — Telehealth: Payer: Self-pay | Admitting: Orthopaedic Surgery

## 2021-03-16 ENCOUNTER — Other Ambulatory Visit: Payer: Self-pay | Admitting: Orthopaedic Surgery

## 2021-03-16 MED ORDER — TRAMADOL HCL 50 MG PO TABS: 100.0000 mg | ORAL_TABLET | Freq: Four times a day (QID) | ORAL | 0 refills | Status: AC | PRN

## 2021-03-16 NOTE — Telephone Encounter (Signed)
Do we need to get her into pain management?

## 2021-03-16 NOTE — Telephone Encounter (Signed)
I talked to the pt and explained this to her. She stated understanding to needing to get into pain management if she needs more tramadol after this point. She will call us back if that is something she wants to do

## 2021-03-16 NOTE — Telephone Encounter (Signed)
Patient called concerning Rx for tramadol. I read note to patient from Dr. Magnus Ivan concerning not filling Rx.  Patient said ok. The number to contact patient if needed is [phone]

## 2021-03-16 NOTE — Telephone Encounter (Signed)
Lvm for pt to cb to discuss this with her

## 2021-04-06 ENCOUNTER — Other Ambulatory Visit: Payer: Self-pay

## 2021-04-06 ENCOUNTER — Telehealth: Payer: Self-pay | Admitting: Orthopaedic Surgery

## 2021-04-06 DIAGNOSIS — Z96651 Presence of right artificial knee joint: Secondary | ICD-10-CM

## 2021-04-06 NOTE — Telephone Encounter (Signed)
Pt called requesting hydrocodone.5 mg. She states she bumped her knee and she is having pain and other medications don't work. Please send to pharmacy on file. Pt phone number [phone].

## 2021-04-06 NOTE — Telephone Encounter (Signed)
Please advise 

## 2021-04-06 NOTE — Telephone Encounter (Signed)
I called and talked to the pt. And informed her. She stated understanding but stated she doesn't know what to do about her pain. She went to local ER this morning and they did xray per pt and told her everything looks good. I instructed the pt to ice rest and take tylenol to help with pain control. Pt also decided she wanted to go to pain management and wants to go somewhere near [facility]. The referral was placed in chart. She was instructed to call us Monday if not better to schedule a sooner f/u

## 2021-05-30 ENCOUNTER — Ambulatory Visit: Payer: Medicare Other | Admitting: Orthopaedic Surgery

## 2021-06-21 ENCOUNTER — Ambulatory Visit (INDEPENDENT_AMBULATORY_CARE_PROVIDER_SITE_OTHER): Payer: Medicare Other

## 2021-06-21 ENCOUNTER — Ambulatory Visit (INDEPENDENT_AMBULATORY_CARE_PROVIDER_SITE_OTHER): Payer: Medicare Other | Admitting: Physician Assistant

## 2021-06-21 ENCOUNTER — Encounter: Payer: Self-pay | Admitting: Physician Assistant

## 2021-06-21 ENCOUNTER — Other Ambulatory Visit: Payer: Self-pay

## 2021-06-21 DIAGNOSIS — Z96651 Presence of right artificial knee joint: Secondary | ICD-10-CM

## 2021-06-21 NOTE — Progress Notes (Signed)
Office Visit Note   Patient: Jane Vasquez           Date of Birth: 03/17/1946           MRN: [id number] Visit Date: 06/21/2021              Requested by: No referring provider defined for this encounter. PCP: Pcp, No   Assessment & Plan: Visit Diagnoses:  1. Status post total right knee replacement     Plan: She will continue to work on range of motion strengthening the knee.  She will follow-up with [place] in 6 months at that time we will obtain an AP and lateral view of her right knee.  Follow-Up Instructions: Return in about 6 months (around 12/20/2021) for Radiographs.   Orders:  Orders Placed This Encounter  Procedures   XR Knee 1-2 Views Right   No orders of the defined types were placed in this encounter.     Procedures: No procedures performed   Clinical Data: No additional findings.   Subjective: Chief Complaint  Patient presents with   Right Knee - Routine Post Op    HPI Ms. Diskin returns today status post right total knee arthroplasty 12/12/2020.  She states she is overall doing great has no concerns.  She is working on Social research officer, government.  She states she does carry her cane with her mostly this for reassurance.  She is having no significant pain in the right knee. Review of Systems  See HPI otherwise negative Objective: Vital Signs: There were no vitals taken for this visit.  Physical Exam Constitutional:      Appearance: She is not ill-appearing or diaphoretic.  Pulmonary:     Effort: Pulmonary effort is normal.  Neurological:     Mental Status: She is alert and oriented to person, place, and time.  Psychiatric:        Mood and Affect: Mood normal.    Ortho Exam Right knee full extension flexion to 110 degrees no instability valgus varus stressing.  Calf supple nontender.  Surgical incisions well-healed no signs of infection. Specialty Comments:  No specialty comments available.  Imaging: XR Knee 1-2 Views Right  Result Date:  06/21/2021 Right knee 2 views: Status post right total knee arthroplasty well-seated components.  Knee is well located.  No acute fractures.  No hardware failure.    PMFS History: Patient Active Problem List   Diagnosis Date Noted   Arthritis of right knee 12/12/2020   Status post total right knee replacement 12/12/2020   Degenerative arthritis of knee, bilateral 06/07/2020   Status post total replacement of right hip 04/23/2019   Unilateral primary osteoarthritis, right hip 04/22/2019   Unilateral primary osteoarthritis, left hip 06/25/2018   Past Medical History:  Diagnosis Date   Gout    Gout    HTN (hypertension)    Osteoarthritis    left knee , right hip     History reviewed. No pertinent family history.  Past Surgical History:  Procedure Laterality Date   APPENDECTOMY  age 60   JOINT REPLACEMENT     TOTAL HIP ARTHROPLASTY Right 04/23/2019   Procedure: RIGHT TOTAL HIP ARTHROPLASTY ANTERIOR APPROACH;  Surgeon: Kathryne Hitch, MD;  Location: [facility];  Service: Orthopedics;  Laterality: Right;   TOTAL KNEE ARTHROPLASTY Right 12/12/2020   Procedure: RIGHT TOTAL KNEE ARTHROPLASTY;  Surgeon: Kathryne Hitch, MD;  Location: [facility];  Service: Orthopedics;  Laterality: Right;   Social History   Occupational  History   Not on file  Tobacco Use   Smoking status: Never   Smokeless tobacco: Current    Types: Snuff  Vaping Use   Vaping Use: Never used  Substance and Sexual Activity   Alcohol use: Not Currently   Drug use: Never   Sexual activity: Yes

## 2021-12-17 IMAGING — DX DG KNEE 1-2V PORT*R*
2 series · 2 of 2 positions shown · non-contrast
Comparison: 09/20/2020

CLINICAL DATA: Postop

EXAM:
PORTABLE RIGHT KNEE - 1-2 VIEW

[knee ap]
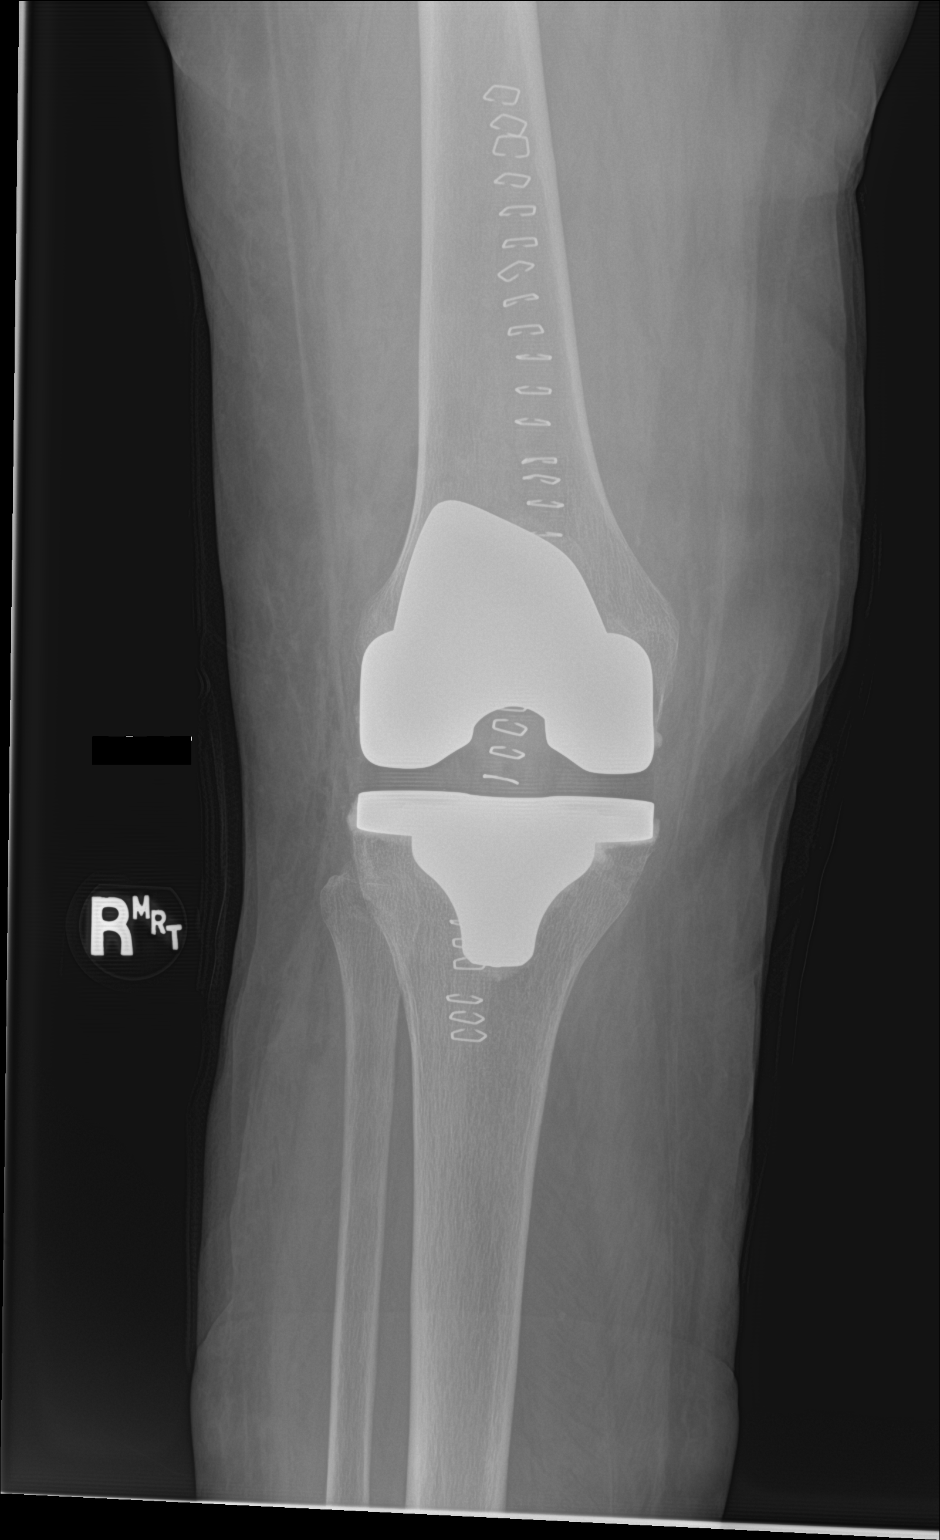

[knee lat]
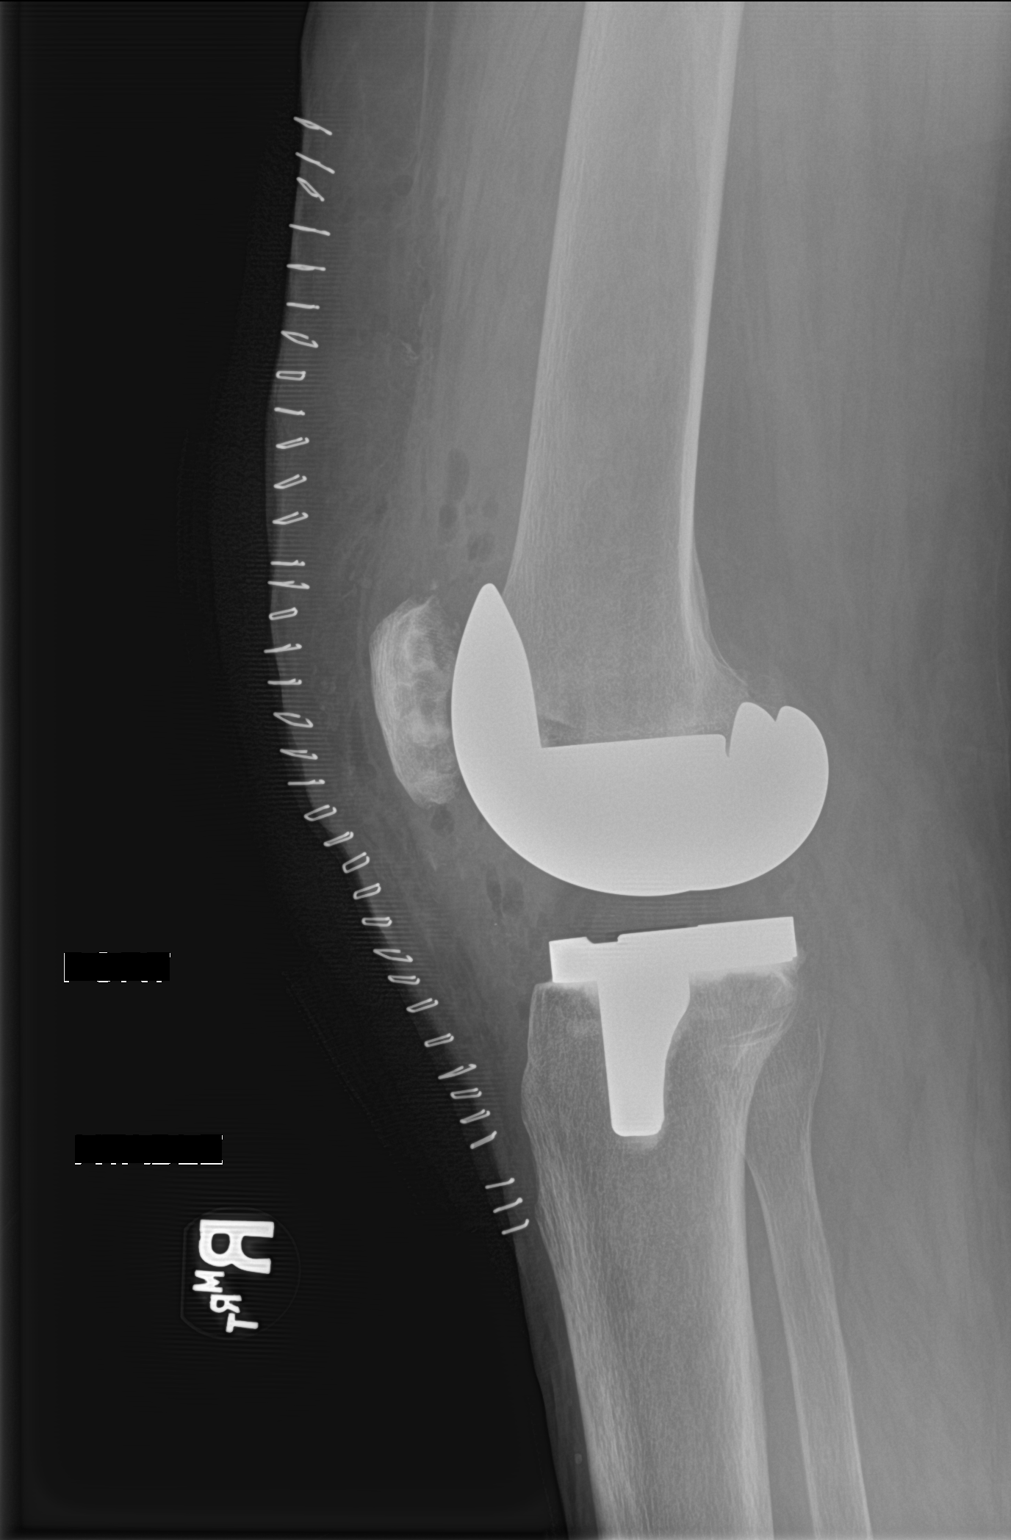

[2 of 2 positions shown; findings below may reference images not displayed]

FINDINGS: Interval right knee replacement with intact hardware and normal
alignment. No fracture. Gas in the soft tissues consistent with
recent surgery
IMPRESSION: Interval right knee replacement with expected postsurgical change

## 2021-12-20 ENCOUNTER — Ambulatory Visit: Payer: Medicare Other | Admitting: Physician Assistant

## 2021-12-27 ENCOUNTER — Ambulatory Visit: Payer: Medicare Other | Admitting: Physician Assistant

## 2022-02-05 ENCOUNTER — Encounter: Payer: Medicare HMO | Admitting: Physician Assistant

## 2022-03-07 ENCOUNTER — Ambulatory Visit (INDEPENDENT_AMBULATORY_CARE_PROVIDER_SITE_OTHER): Payer: Medicare HMO

## 2022-03-07 ENCOUNTER — Encounter: Payer: Self-pay | Admitting: Physician Assistant

## 2022-03-07 ENCOUNTER — Ambulatory Visit (INDEPENDENT_AMBULATORY_CARE_PROVIDER_SITE_OTHER): Payer: Medicare HMO | Admitting: Physician Assistant

## 2022-03-07 DIAGNOSIS — Z96651 Presence of right artificial knee joint: Secondary | ICD-10-CM

## 2022-03-07 NOTE — Progress Notes (Signed)
HPI: Jane Vasquez returns today status post right total knee arthroplasty 12/12/2020.  She states the knee is doing well.  She has no pain in the knee.  She has had no injury to the knee.  She is pleased with her overall progress.  Review of systems see HPI otherwise negative  Physical exam: Right knee full extension full flexion.  No instability valgus varus stressing.  Surgical incisions healing well slight keloid.  Calf supple nontender dorsiflexion plantarflexion right ankle intact.  Radiographs: 2 views right knee shows well-seated right total knee arthroplasty components.  There is no acute fractures.  Knee is well located.  No acute findings.  Impression: Status post right total knee arthroplasty  Plan: She will continue work on scar tissue mobilization.  Continue work on range of motion strengthening the knee.  Follow-up with [place] as needed.  Questions encouraged and answered at length.

## 2023-06-26 ENCOUNTER — Encounter: Payer: Self-pay | Admitting: Podiatry

## 2023-06-26 ENCOUNTER — Ambulatory Visit (INDEPENDENT_AMBULATORY_CARE_PROVIDER_SITE_OTHER): Payer: Medicare HMO | Admitting: Podiatry

## 2023-06-26 ENCOUNTER — Ambulatory Visit: Payer: Medicare HMO

## 2023-06-26 DIAGNOSIS — M205X2 Other deformities of toe(s) (acquired), left foot: Secondary | ICD-10-CM

## 2023-06-26 DIAGNOSIS — M778 Other enthesopathies, not elsewhere classified: Secondary | ICD-10-CM

## 2023-06-26 DIAGNOSIS — M205X1 Other deformities of toe(s) (acquired), right foot: Secondary | ICD-10-CM | POA: Diagnosis not present

## 2023-06-26 DIAGNOSIS — M109 Gout, unspecified: Secondary | ICD-10-CM | POA: Diagnosis not present

## 2023-06-26 MED ORDER — PREDNISONE 10 MG PO TABS: ORAL_TABLET | ORAL | 0 refills | Status: AC

## 2023-06-26 NOTE — Progress Notes (Signed)
Chief Complaint  Patient presents with   Foot Pain    She has noticed pain with walking a lot for several years, no injury. She uses heat to help some  with pain, she takes prednisone, colchecine, and tramadol.    HPI: 77 y.o. female presents today with chronic bilateral foot pain, right worse than left.  She states that is really gotten worse over the past year or so.  She has history of gout which she takes prednisone, colchicine, allopurinol for.  History of multiple joint replacement surgeries and osteoarthritis affecting other joints.  Relates most of her pain is in the right midfoot dorsally and plantarly and over bunion eminence bilaterally.  Denies any nausea, vomiting, fever, chills, chest pain, shortness of breath.  Past Medical History:  Diagnosis Date   Gout    Gout    HTN (hypertension)    Osteoarthritis    left knee , right hip     Past Surgical History:  Procedure Laterality Date   APPENDECTOMY  age 59   JOINT REPLACEMENT     TOTAL HIP ARTHROPLASTY Right 04/23/2019   Procedure: RIGHT TOTAL HIP ARTHROPLASTY ANTERIOR APPROACH;  Surgeon: Kathryne Hitch, MD;  Location: [facility];  Service: Orthopedics;  Laterality: Right;   TOTAL KNEE ARTHROPLASTY Right 12/12/2020   Procedure: RIGHT TOTAL KNEE ARTHROPLASTY;  Surgeon: Kathryne Hitch, MD;  Location[facility];  Service: Orthopedics;  Laterality: Right;    Allergies  Allergen Reactions   Codeine Hives   Shellfish-Derived Products Other (See Comments)    Seafood Gout   Elemental Sulfur Hives and Rash   Penicillins Hives and Rash    Did it involve swelling of the face/tongue/throat, SOB, or low BP? No Did it involve sudden or severe rash/hives, skin peeling, or any reaction on the inside of your mouth or nose? No Did you need to seek medical attention at a hospital or doctor's office? No When did it last happen?       If all above answers are "NO", may proceed with cephalosporin use.     ROS  negative except as stated in HPI   Physical Exam: There were no vitals filed for this visit.  General: The patient is alert and oriented x3 in no acute distress.  Dermatology: Skin is warm, dry and supple bilateral lower extremities. Interspaces are clear of maceration and debris.  Mild hyperkeratotic tissue present to left forefoot subsecond metatarsal head  Vascular: Palpable pedal pulses bilaterally. Capillary refill within normal limits.  +1 pitting edema present.  No erythema or calor.  Neurological: Light touch sensation grossly intact bilateral feet.   Musculoskeletal Exam: Minimally reducible bunion deformities bilaterally.  Decreased first MPJ range of motion with approximately 30 degrees of dorsiflexion.  Lateral deviation of the toes.  Decreased ankle joint dorsiflexion.  Radiographic Exam: Left and right foot 06/26/2023 No fractures or acute osseous abnormalities appreciated.  Moderate to severe bunion deformities bilaterally.  Loss of first MPJ joint space bilaterally.  Erosions of the first metatarsal head left greater than right consistent with gouty arthritis.  Lateral deviation of all toes with hammertoe contractures.  Mild met adductus.  Pes planus foot type.  Left foot os tibial externum noted.  Assessment/Plan of Care: 1. Capsulitis of foot   2. Hallux limitus of left foot   3. Hallux limitus of right foot   4. Gouty arthritis of foot      Meds ordered this encounter  Medications  predniSONE (DELTASONE) 10 MG tablet    Sig: Take 6 tablets (60 mg total) by mouth daily with breakfast for 2 days, THEN 5 tablets (50 mg total) daily with breakfast for 2 days, THEN 4 tablets (40 mg total) daily with breakfast for 2 days, THEN 3 tablets (30 mg total) daily with breakfast for 2 days, THEN 2 tablets (20 mg total) daily with breakfast for 2 days, THEN 1 tablet (10 mg total) daily with breakfast for 2 days. 12 Day tapering Dose. Take in AM with breakfast.    Dispense:  42  tablet    Refill:  0   None  Discussed clinical findings with patient today.  Plan:  # Bilateral hallux limitus, bunion deformities complicated by chronic gout - Lengthy discussion with patient regarding conservative versus surgical intervention. Discussed with patient that she would likely need extensive forefoot correction of deformities and require extensive period of nonweightbearing -Discussed that conservative treatment would include course of oral steroids, NSAIDs, compressive sleeves for bracing, use of a stiff soled shoe to minimize motion of the painful joints, shoegear modification and good shoegear choice accounting for wide toebox, and use of custom or good over the counter orthotics - Dispensed information on custom orthotics with Morton's extension. Patient will determine if insurance will provide some coverage for this or if this is cost prohibitive. -Follow-up in 6 to 8 weeks for reevaluation, determination if we will proceed with continuing conservative therapy vs discussion of surgical intervention.  Soloman Mckeithan L. Marchia Bond, AACFAS [facility]     [address] [place], [place] [address]                Office [phone]  Fax [phone]

## 2023-06-27 ENCOUNTER — Encounter: Payer: Self-pay | Admitting: Podiatry

## 2023-09-05 ENCOUNTER — Ambulatory Visit: Payer: Medicare HMO | Admitting: Podiatry

## 2023-10-13 ENCOUNTER — Telehealth: Payer: Self-pay | Admitting: Podiatry

## 2023-10-13 NOTE — Telephone Encounter (Signed)
 Patient [facility] [place] [address]

## 2023-10-16 ENCOUNTER — Telehealth: Payer: Self-pay | Admitting: Podiatry

## 2023-10-16 NOTE — Telephone Encounter (Signed)
 Patient is requesting to get a prescription, 10 day supply of Prednisone. Patient contact telephone number, [phone]

## 2023-11-06 ENCOUNTER — Ambulatory Visit: Admitting: Podiatry

## 2024-05-06 NOTE — Progress Notes (Unsigned)
 Cardiology Office Note:  .   Date:  05/07/2024  ID:  Jane Vasquez, DOB 1945-09-03, MRN [id number] PCP: Pcp, No  [facility] Providers Cardiologist:  None {  History of Present Illness: .    Chief Complaint  Patient presents with   Dizziness         Jane Vasquez is a 78 y.o. female with history of HTN who presents for the evaluation of LE edema at the request of Mitchell Browning, PA-C.   History of Present Illness   Jane Vasquez is a 78 year old female with hypertension who presents with lower extremity edema. She is accompanied by her daughter, Dawna. She was referred by Dr. Browning Mitchell for evaluation of leg swelling.  She has experienced lower extremity edema for approximately 15 years, with worsening over the past few months. The swelling primarily affects her knees and ankles, particularly when her blood pressure is uncontrolled. She has been taking Lasix for the past three months, initially at 20 mg twice daily, but has reduced to once daily as the swelling improved. She wears support hose daily.  Her blood pressure has been difficult to control, with readings ranging from 130/60 to 175/60. She is currently on verapamil 240 mg daily and losartan  50 mg twice daily. She has a history of high blood pressure and high cholesterol, for which she takes medication. No history of diabetes, heart attack, or stroke. No chest pain or trouble breathing.  She is active and participates in physical therapy for neck arthritis. She has been diagnosed with osteoarthritis and has seen a rheumatologist. She is allergic to sulfa and penicillin.  She has a family history of high blood pressure, with both parents having taken blood pressure medication. She denies smoking, alcohol, or drug use. 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          Problem List HTN Osteoarthritis     ROS: All other ROS reviewed and negative. Pertinent positives  noted in the HPI.     Studies Reviewed: SABRA   EKG Interpretation Date/Time:  Friday May 07 2024 13:57:53 EST Ventricular Rate:  71 PR Interval:  132 QRS Duration:  84 QT Interval:  388 QTC Calculation: 421 R Axis:   21  Text Interpretation: Normal sinus rhythm Normal ECG Confirmed by Barbaraann Kotyk [id number]) on 05/07/2024 1:58:58 PM   Physical Exam:   VS:  BP (!) 140/78 (BP Location: Left Arm, Patient Position: Sitting, Cuff Size: Normal)   Pulse 71   Ht 5' 5 (1.651 m)   Wt 160 lb (72.6 kg)   SpO2 98%   BMI 26.63 kg/m    Wt Readings from Last 3 Encounters:  05/07/24 160 lb (72.6 kg)  12/08/20 150 lb 6.4 oz (68.2 kg)  09/20/20 155 lb 9.6 oz (70.6 kg)    GEN: Well nourished, well developed in no acute distress NECK: No JVD; No carotid bruits CARDIAC: RRR, no murmurs, rubs, gallops RESPIRATORY:  Clear to auscultation without rales, wheezing or rhonchi  ABDOMEN: Soft, non-tender, non-distended EXTREMITIES:  No edema; No deformity  ASSESSMENT AND PLAN: .   Assessment and Plan    Chronic lower extremity edema Edema present for 15 years, worsened recently. Improved with Lasix and salt reduction. No CHF signs. - Continue salt intake reduction. - Ordered echocardiogram to assess heart function.  Essential hypertension Hypertension with suboptimal control. Blood pressure 130/60 to 175/60. Losartan   replaced with irbesartan for better efficacy. Target BP 130/80 mmHg. - Discontinued losartan . - Initiated irbesartan (Avapro) 150 mg BID. - Continue verapamil 240 mg daily. - Instructed to monitor blood pressure twice daily and maintain a log. - Scheduled follow-up in 6 weeks for blood pressure check.              Follow-up: Return in about 6 weeks (around 06/18/2024).   Signed, Darryle DASEN. Barbaraann, MD, [facility]  [facility]  [address] [place], [place] [address] [phone]  2:28 PM

## 2024-05-07 ENCOUNTER — Ambulatory Visit: Attending: Cardiovascular Disease | Admitting: Cardiovascular Disease

## 2024-05-07 ENCOUNTER — Encounter: Payer: Self-pay | Admitting: Cardiovascular Disease

## 2024-05-07 VITALS — BP 140/78 | HR 71 | Ht 65.0 in | Wt 160.0 lb

## 2024-05-07 DIAGNOSIS — R600 Localized edema: Secondary | ICD-10-CM

## 2024-05-07 DIAGNOSIS — I10 Essential (primary) hypertension: Secondary | ICD-10-CM

## 2024-05-07 MED ORDER — IRBESARTAN 150 MG PO TABS: 150.0000 mg | ORAL_TABLET | Freq: Two times a day (BID) | ORAL | 3 refills | Status: AC

## 2024-05-07 NOTE — Patient Instructions (Addendum)
 Medication Instructions:  Your physician has recommended you make the following change in your medication:  Stop losartan  Start Avapro 150 mg twice daily  Lab Work: None ordered.  You may go to any [facility] Location for your lab work:  [facility] - [address] ([facility]) - [address] [phone] [address]  [place] - [address]   [place]  - [address]   [place] - [address] - [address] [place]  - [address] ([facility]   If you have labs (blood work) drawn today and your tests are completely normal, you will receive your results only by: [employer] (if you have MyChart)  If you have any lab test that is abnormal or we need to change your treatment, we will call you or send a MyChart message to review the results.  Testing/Procedures: Your physician has requested that you have an echocardiogram. Echocardiography is a painless test that uses sound waves to create images of your heart. It provides your doctor with information about the size and shape of your heart and how well your heart's chambers and valves are working. This procedure takes approximately one hour. There are no restrictions for this procedure. Please do NOT wear cologne, perfume, aftershave, or lotions (deodorant is allowed). Please arrive 15 minutes prior to your appointment time.  Please note: We ask at that you not bring children with you during ultrasound (echo/ vascular) testing. Due to room size and safety concerns, children are not allowed in the ultrasound rooms during exams. Our front office staff cannot provide observation of children in our lobby area while testing is being conducted. An adult accompanying a patient to their appointment will only be allowed in the ultrasound room at the discretion of the ultrasound technician under special  circumstances. We apologize for any inconvenience.   Follow-Up: At [facility], you and your health needs are our priority.  As part of our continuing mission to provide you with exceptional heart care, we have created designated Provider Care Teams.  These Care Teams include your primary Cardiologist (physician) and Advanced Practice Providers (APPs -  Physician Assistants and Nurse Practitioners) who all work together to provide you with the care you need, when you need it.  Your next appointment:   6 weeks with Dr Barbaraann

## 2024-06-18 ENCOUNTER — Ambulatory Visit (HOSPITAL_COMMUNITY)
Admission: RE | Admit: 2024-06-18 | Discharge: 2024-06-18 | Disposition: A | Discharge status: Home / Self Care | Source: Ambulatory Visit | Attending: Cardiovascular Disease | Admitting: Cardiovascular Disease

## 2024-06-18 DIAGNOSIS — I10 Essential (primary) hypertension: Secondary | ICD-10-CM | POA: Insufficient documentation

## 2024-06-18 LAB — ECHOCARDIOGRAM COMPLETE
Area-P 1/2: 3.21 cm2
MV VTI: 3.01 cm2
S' Lateral: 2.1 cm

## 2024-06-20 ENCOUNTER — Ambulatory Visit: Payer: Self-pay | Admitting: Cardiovascular Disease

## 2024-06-25 ENCOUNTER — Ambulatory Visit: Attending: Cardiovascular Disease | Admitting: Cardiovascular Disease

## 2024-06-25 ENCOUNTER — Encounter: Payer: Self-pay | Admitting: Cardiovascular Disease

## 2024-06-25 VITALS — BP 164/80 | HR 79 | Ht 65.0 in | Wt 159.8 lb

## 2024-06-25 DIAGNOSIS — R600 Localized edema: Secondary | ICD-10-CM

## 2024-06-25 DIAGNOSIS — I517 Cardiomegaly: Secondary | ICD-10-CM

## 2024-06-25 DIAGNOSIS — I10 Essential (primary) hypertension: Secondary | ICD-10-CM

## 2024-06-25 DIAGNOSIS — I5032 Chronic diastolic (congestive) heart failure: Secondary | ICD-10-CM

## 2024-06-25 LAB — CBC
Hematocrit: 38.8 % (ref 34.0–46.6)
Hemoglobin: 12.4 g/dL (ref 11.1–15.9)
MCH: 28.6 pg (ref 26.6–33.0)
MCHC: 32 g/dL (ref 31.5–35.7)
MCV: 89 fL (ref 79–97)
Platelets: 313 x10E3/uL (ref 150–450)
RBC: 4.34 x10E6/uL (ref 3.77–5.28)
RDW: 14.1 % (ref 11.7–15.4)
WBC: 11.5 x10E3/uL — ABNORMAL HIGH (ref 3.4–10.8)

## 2024-06-25 LAB — BASIC METABOLIC PANEL WITH GFR
BUN/Creatinine Ratio: 24 (ref 12–28)
BUN: 21 mg/dL (ref 8–27)
CO2: 22 mmol/L (ref 20–29)
Calcium: 9.7 mg/dL (ref 8.7–10.3)
Chloride: 102 mmol/L (ref 96–106)
Creatinine, Ser: 0.87 mg/dL (ref 0.57–1.00)
Glucose: 92 mg/dL (ref 70–99)
Potassium: 4.3 mmol/L (ref 3.5–5.2)
Sodium: 141 mmol/L (ref 134–144)
eGFR: 69 mL/min/1.73

## 2024-06-25 MED ORDER — NIFEDIPINE ER OSMOTIC RELEASE 90 MG PO TB24: 90.0000 mg | ORAL_TABLET | Freq: Every day | ORAL | 3 refills | Status: AC

## 2024-06-25 NOTE — Patient Instructions (Signed)
 Medication Instructions:  Your physician has recommended you make the following change in your medication:  STOP: verapamil  START: nifedipine  (Procardia ) 90 mg by mouth once daily  *If you need a refill on your cardiac medications before your next appointment, please call your pharmacy*  Lab Work: At [facility]: BMP, CBC  If you have labs (blood work) drawn today and your tests are completely normal, you will receive your results only by: MyChart Message (if you have MyChart) OR A paper copy in the mail If you have any lab test that is abnormal or we need to change your treatment, we will call you to review the results.  Testing/Procedures: Your physician has requested that you have a lower extremity venous duplex. This test is an ultrasound of the veins in the legs. It looks at venous blood flow that carries blood from the heart to the legs. Allow one hour for a Lower Venous exam.  There are no restrictions or special instructions.  Please note: We ask at that you not bring children with you during ultrasound (echo/ vascular) testing. Due to room size and safety concerns, children are not allowed in the ultrasound rooms during exams. Our front office staff cannot provide observation of children in our lobby area while testing is being conducted. An adult accompanying a patient to their appointment will only be allowed in the ultrasound room at the discretion of the ultrasound technician under special circumstances. We apologize for any inconvenience.    Your physician has requested that you have a cardiac MRI. Cardiac MRI uses a computer to create images of your heart as its beating, producing both still and moving pictures of your heart and major blood vessels. For further information please visit [email or web address]. Please follow the instruction sheet given to you today for more information.    Follow-Up: At [facility], you and your health needs are our priority.  As part of our  continuing mission to provide you with exceptional heart care, our providers are all part of one team.  This team includes your primary Cardiologist (physician) and Advanced Practice Providers or APPs (Physician Assistants and Nurse Practitioners) who all work together to provide you with the care you need, when you need it.  Your next appointment:   3 month(s)  Provider:   Darryle ONEIDA Decent, MD    We recommend signing up for the patient portal called MyChart.  Sign up information is provided on this After Visit Summary.  MyChart is used to connect with patients for Virtual Visits (Telemedicine).  Patients are able to view lab/test results, encounter notes, upcoming appointments, etc.  Non-urgent messages can be sent to your provider as well.   To learn more about what you can do with MyChart, go to [email or web address].   Other Instructions Please check your Blood Pressure twice daily for a 2-3 weeks call in log for MD to review    You are scheduled for Cardiac MRI at the location below.  Please arrive for your appointment at ______________ . ?  [facility] [address] [place], [place] [address] Please take advantage of the free valet parking available at the [facility] and [employer] (Entrance C).  Proceed to the [facility] Radiology Department (First Floor) for check-in.   OR   [facility] [address] [place], [place] [address] Please go to the [facility] and check-in with the desk attendant.   Magnetic resonance imaging (MRI) is a painless test  that produces images of the inside of the body without using Xrays.  During an MRI, strong magnets and radio waves work together in a data processing manager to form detailed images.   MRI images may provide more details about a medical condition than X-rays, CT scans, and ultrasounds can provide.  You may be given earphones to listen for instructions.  You may eat a light breakfast and  take medications as ordered with the exception of furosemide, hydrochlorothiazide , chlorthalidone  or spironolactone (or any other fluid pill). If you are undergoing a stress MRI, please avoid stimulants for 12 hr prior to test. (I.e. Caffeine, nicotine, chocolate, or antihistamine medications)  If your provider has ordered anti-anxiety medications for this test, then you will need a driver.  An IV will be inserted into one of your veins. Contrast material will be injected into your IV. It will leave your body through your urine within a day. You may be told to drink plenty of fluids to help flush the contrast material out of your system.  You will be asked to remove all metal, including: Watch, jewelry, and other metal objects including hearing aids, hair pieces and dentures. Also wearable glucose monitoring systems (ie. Freestyle Libre and Omnipods) (Braces and fillings normally are not a problem.)   TEST WILL TAKE APPROXIMATELY 1 HOUR  PLEASE NOTIFY SCHEDULING AT LEAST 24 HOURS IN ADVANCE IF YOU ARE UNABLE TO KEEP YOUR APPOINTMENT. [phone]  For more information and frequently asked questions, please visit our website : [email or web address]  Please call the Cardiac Imaging Nurse Navigators with any questions/concerns. [phone] Office

## 2024-06-25 NOTE — Progress Notes (Signed)
 " Cardiology Office Note:  .   Date:  06/25/2024  ID:  Jane Vasquez, DOB 1946-02-04, MRN [id number] PCP: Mitchell Browning, DEVONNA  [facility] Providers Cardiologist:  Darryle ONEIDA Decent, MD   History of Present Illness: .   No chief complaint on file.   Jane Vasquez is a 78 y.o. female with below history who presents for follow-up.   History of Present Illness   Jane Vasquez is a 77 year old female with hypertension who presents for follow-up.  She was initiated on irbesartan  300 mg daily and verapamil 240 mg daily. Her blood pressure readings have been around 165/85 mmHg in the morning and 140/60 mmHg in the afternoon, with occasional evening readings of 165/75 mmHg. She forgot to bring her blood pressure log to the appointment. She is currently taking irbesartan  150 mg twice daily and verapamil 240 mg daily. No chest pain or trouble breathing.  An echocardiogram showed left ventricular hypertrophy and an outflow tract gradient. She has not had an MRI before and has no metal in her body. She is allergic to penicillin, sulfa, and shellfish.  She is also on Lasix 20 mg daily for lower extremity edema. No swelling in the right leg, but some swelling persists in the left leg. She has concerns about frequent urination but acknowledges improvement in swelling.  She recently had cataract surgery and has a history of knee and hip replacements on the right side. No pain in the left knee, and she has not had any evaluation for blood clots in the left leg. She denies any pain in the left leg.           Problem List HTN Osteoarthritis  HFpEF    ROS: All other ROS reviewed and negative. Pertinent positives noted in the HPI.     Studies Reviewed: SABRA       TTE 06/18/2024  1. Left ventricular ejection fraction, by estimation, is 65 to 70%. The  left ventricle has normal function. The left ventricle has no regional  wall motion abnormalities. There is mild concentric left ventricular   hypertrophy. Left ventricular diastolic  parameters are consistent with Grade II diastolic dysfunction  (pseudonormalization). There was turbulence across the LV outflow tract  but no significant LVOT gradient was measured. The LVOT was narrowed and  there may be mitral valve systolic anterior  motion but it was not well-visualized.   2. Right ventricular systolic function is normal. The right ventricular  size is normal. There is mildly elevated pulmonary artery systolic  pressure. The estimated right ventricular systolic pressure is 39.4 mmHg.   3. Left atrial size was mildly dilated.   4. The mitral valve is degenerative. Trivial mitral valve regurgitation.  No evidence of mitral stenosis. Moderate mitral annular calcification. The  anterior leaflet of the mitral valve was not well-visualized due to  shadowing from MAC, but there may be  systolic anterior motion present.   5. The aortic valve is tricuspid. There is mild calcification of the  aortic valve. Aortic valve regurgitation is not visualized. No aortic  stenosis is present.   6. The inferior vena cava is dilated in size with >50% respiratory  variability, suggesting right atrial pressure of 8 mmHg.   Physical Exam:   VS:  BP (!) 164/80 (BP Location: Left Arm, Patient Position: Sitting, Cuff Size: Normal)   Pulse 79   Ht 5' 5 (1.651 m)   Wt 159 lb 12.8 oz (72.5 kg)  SpO2 97%   BMI 26.59 kg/m    Wt Readings from Last 3 Encounters:  06/25/24 159 lb 12.8 oz (72.5 kg)  05/07/24 160 lb (72.6 kg)  12/08/20 150 lb 6.4 oz (68.2 kg)    GEN: Well nourished, well developed in no acute distress NECK: No JVD; No carotid bruits CARDIAC: RRR, no murmurs, rubs, gallops RESPIRATORY:  Clear to auscultation without rales, wheezing or rhonchi  ABDOMEN: Soft, non-tender, non-distended EXTREMITIES:  1+ pitting edema LLE ASSESSMENT AND PLAN: .   Assessment and Plan    Primary hypertension Blood pressure remains elevated despite  current regimen. Medication adjustment required. - Discontinued verapamil. - Initiate nifedipine  90 mg daily. - Continue irbesartan  150 mg BID. - Monitor blood pressure twice daily and send log via MyChart. - Ordered CBC and BMET to assess kidney function.  Left ventricular hypertrophy, possible hypertrophic cardiomyopathy Echocardiogram shows LVH with possible hypertrophic cardiomyopathy. Further evaluation needed to rule out other causes. - Ordered cardiac MRI to evaluate heart muscle thickness and rule out other conditions.  Heart failure with preserved ejection fraction Echocardiogram shows normal ejection fraction but slightly elevated pressures, indicating fluid retention tendency. - Continue Lasix 20 mg daily to manage fluid retention. - Monitor salt intake and blood pressure control.  Left lower extremity edema, rule out deep vein thrombosis Asymmetric edema persists despite Lasix. Low suspicion for DVT, but evaluation warranted. - Ordered DVT duplex study of the left lower extremity.                Follow-up: No follow-ups on file.  Signed, Darryle DASEN. Barbaraann, MD, [facility]  [facility]  [address] [place], [place] [address] [phone]  4:31 PM   "

## 2024-06-27 ENCOUNTER — Ambulatory Visit: Payer: Self-pay | Admitting: Cardiovascular Disease

## 2024-06-30 ENCOUNTER — Telehealth: Payer: Self-pay | Admitting: Cardiovascular Disease

## 2024-06-30 NOTE — Telephone Encounter (Signed)
 Pt's daughter returning nurses call regarding LE Venous being done sooner. Daughter states that pt is currently out of town and would not be able to come in on 1/2/269. She would like a c/b regarding this matter. Please advise

## 2024-07-09 ENCOUNTER — Ambulatory Visit (HOSPITAL_COMMUNITY)
Admission: RE | Admit: 2024-07-09 | Discharge: 2024-07-09 | Disposition: A | Source: Ambulatory Visit | Attending: Cardiovascular Disease

## 2024-07-09 DIAGNOSIS — I10 Essential (primary) hypertension: Secondary | ICD-10-CM | POA: Insufficient documentation

## 2024-07-09 DIAGNOSIS — I517 Cardiomegaly: Secondary | ICD-10-CM | POA: Diagnosis present

## 2024-07-16 ENCOUNTER — Ambulatory Visit (HOSPITAL_COMMUNITY)

## 2024-07-19 ENCOUNTER — Ambulatory Visit (HOSPITAL_COMMUNITY)

## 2024-07-21 ENCOUNTER — Ambulatory Visit (HOSPITAL_COMMUNITY)

## 2024-09-17 ENCOUNTER — Ambulatory Visit: Admitting: Cardiovascular Disease
